# Patient Record
Sex: Female | Born: 1940
Health system: Southern US, Community
[De-identification: ages and names within clinical notes are randomized; demographics above are authoritative.]

## PROBLEM LIST (undated history)

## (undated) DIAGNOSIS — M797 Fibromyalgia: Secondary | ICD-10-CM

## (undated) DIAGNOSIS — I099 Rheumatic heart disease, unspecified: Secondary | ICD-10-CM

## (undated) DIAGNOSIS — I10 Essential (primary) hypertension: Secondary | ICD-10-CM

## (undated) DIAGNOSIS — K589 Irritable bowel syndrome without diarrhea: Secondary | ICD-10-CM

## (undated) DIAGNOSIS — E785 Hyperlipidemia, unspecified: Secondary | ICD-10-CM

## (undated) DIAGNOSIS — M1990 Unspecified osteoarthritis, unspecified site: Secondary | ICD-10-CM

## (undated) DIAGNOSIS — I4891 Unspecified atrial fibrillation: Secondary | ICD-10-CM

## (undated) DIAGNOSIS — R202 Paresthesia of skin: Secondary | ICD-10-CM

## (undated) DIAGNOSIS — K317 Polyp of stomach and duodenum: Secondary | ICD-10-CM

## (undated) HISTORY — PX: ABDOMINAL HYSTERECTOMY: SHX81

## (undated) HISTORY — PX: ESOPHAGOGASTRODUODENOSCOPY: SHX1529

## (undated) HISTORY — DX: Paresthesia of skin: R20.2

## (undated) HISTORY — DX: Fibromyalgia: M79.7

## (undated) HISTORY — PX: KNEE SURGERY: SHX244

## (undated) HISTORY — DX: Irritable bowel syndrome, unspecified: K58.9

## (undated) HISTORY — DX: Polyp of stomach and duodenum: K31.7

## (undated) HISTORY — DX: Unspecified atrial fibrillation: I48.91

## (undated) HISTORY — PX: CATARACT EXTRACTION: SUR2

## (undated) HISTORY — DX: Hyperlipidemia, unspecified: E78.5

## (undated) HISTORY — DX: Essential (primary) hypertension: I10

## (undated) HISTORY — DX: Rheumatic heart disease, unspecified: I09.9

## (undated) HISTORY — DX: Unspecified osteoarthritis, unspecified site: M19.90

---

## 2011-10-18 HISTORY — PX: COLONOSCOPY: SHX174

## 2014-07-05 ENCOUNTER — Encounter: Payer: Self-pay | Admitting: Internal Medicine

## 2014-07-06 ENCOUNTER — Encounter: Payer: Self-pay | Admitting: Critical Care Medicine

## 2014-07-06 ENCOUNTER — Ambulatory Visit (INDEPENDENT_AMBULATORY_CARE_PROVIDER_SITE_OTHER): Payer: Self-pay | Admitting: Critical Care Medicine

## 2014-07-06 VITALS — BP 138/84 | HR 85 | Temp 97.6°F | Ht 63.5 in | Wt 215.8 lb

## 2014-07-06 DIAGNOSIS — R0600 Dyspnea, unspecified: Secondary | ICD-10-CM

## 2014-07-06 DIAGNOSIS — I10 Essential (primary) hypertension: Secondary | ICD-10-CM | POA: Insufficient documentation

## 2014-07-06 DIAGNOSIS — J984 Other disorders of lung: Secondary | ICD-10-CM

## 2014-07-06 DIAGNOSIS — E785 Hyperlipidemia, unspecified: Secondary | ICD-10-CM

## 2014-07-06 DIAGNOSIS — K2270 Barrett's esophagus without dysplasia: Secondary | ICD-10-CM | POA: Insufficient documentation

## 2014-07-06 DIAGNOSIS — M1990 Unspecified osteoarthritis, unspecified site: Secondary | ICD-10-CM | POA: Insufficient documentation

## 2014-07-06 DIAGNOSIS — R0689 Other abnormalities of breathing: Secondary | ICD-10-CM

## 2014-07-06 DIAGNOSIS — M19021 Primary osteoarthritis, right elbow: Secondary | ICD-10-CM

## 2014-07-06 DIAGNOSIS — K317 Polyp of stomach and duodenum: Secondary | ICD-10-CM | POA: Insufficient documentation

## 2014-07-06 DIAGNOSIS — I482 Chronic atrial fibrillation, unspecified: Secondary | ICD-10-CM

## 2014-07-06 DIAGNOSIS — I4891 Unspecified atrial fibrillation: Secondary | ICD-10-CM | POA: Insufficient documentation

## 2014-07-06 DIAGNOSIS — I2510 Atherosclerotic heart disease of native coronary artery without angina pectoris: Secondary | ICD-10-CM

## 2014-07-06 DIAGNOSIS — M19022 Primary osteoarthritis, left elbow: Secondary | ICD-10-CM

## 2014-07-06 NOTE — Progress Notes (Signed)
Subjective:    Patient ID: Joann Mcdonald, female    DOB: [birth year], 74 y.o.   MRN: [id number]  HPI Comments: Patient presents with: Pulmonary Consult: Ref. by Dr. Lyndel Safe for sob with exertion x 51yrs.,no wheezing, no cough. Denies cp or tightness.Runny nose-clear, pnd. No fcs  Referred dyspnea x 5yrs, : up drive way, bending over  Shortness of Breath This is a chronic problem. The current episode started more than 1 month ago. The problem occurs daily (exertional, bend over, up incline). The problem has been gradually worsening. Associated symptoms include abdominal pain. Pertinent negatives include no chest pain, claudication, coryza, ear pain, fever, headaches, hemoptysis, leg pain, leg swelling, neck pain, orthopnea, PND, rash, rhinorrhea, sore throat, sputum production, swollen glands, syncope, vomiting or wheezing. The symptoms are aggravated by exercise. Risk factors include smoking (former smoker, quit 1978). She has tried nothing for the symptoms. There is no history of allergies, aspirin allergies, asthma, bronchiolitis, COPD, DVT, PE or pneumonia.   Past Medical History  Diagnosis Date  . 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 Atrial fibrillation   . HTN (hypertension), benign   . Osteoarthritis   . Fibromyalgia   . Hyperlipemia   . Gastric polyps   . IBS (irritable bowel syndrome)      Family History  Problem Relation Age of Onset  . Angina Mother   . Emphysema Father   . Non-Hodgkin's lymphoma Brother      History   Social History  . Marital Status: Widowed    Spouse Name: N/A  . Number of Children: N/A  . Years of Education: N/A   Occupational History  . Retired    Social History Main Topics  . Smoking status: Former Smoker -- 0.25 packs/day for 20 years    Types: Cigarettes    Quit date: 02/20/1976  . Smokeless tobacco: Never Used  . Alcohol Use: No  . Drug Use: Not on file  . Sexual Activity: Not on file   Other Topics Concern  . Not on  file   Social History Narrative   Lives alone,lives near daughter. Widowed.   Retired.   [employer] x 26 yrs.     No Known Allergies   No outpatient prescriptions prior to visit.   No facility-administered medications prior to visit.     Current outpatient prescriptions:  .  aspirin EC 81 MG tablet, Take 81 mg by mouth daily., Disp: , Rfl:  .  co-enzyme Q-10 30 MG capsule, Take 30 mg by mouth daily., Disp: , Rfl:  .  Krill Oil 300 MG CAPS, Take 300 mg by mouth daily., Disp: , Rfl:  .  LORazepam (ATIVAN) 1 MG tablet, Take 1 mg by mouth every 8 (eight) hours as needed for anxiety., Disp: , Rfl:  .  metoprolol succinate (TOPROL-XL) 100 MG 24 hr tablet, Take 100 mg by mouth daily. Take with or immediately following a meal., Disp: , Rfl:  .  OVER THE COUNTER MEDICATION, Zyflamend (Tumeric) 2 tablets every day by mouth., Disp: , Rfl:  .  pantoprazole (PROTONIX) 40 MG tablet, Take 40 mg by mouth daily., Disp: , Rfl:  .  spironolactone (ALDACTONE) 25 MG tablet, Take 25 mg by mouth daily., Disp: , Rfl:   Review of Systems  Constitutional: Negative for fever, chills, diaphoresis, activity change, appetite change, fatigue and unexpected weight change.  HENT: Positive for postnasal drip and trouble swallowing. Negative for congestion, dental problem, ear discharge, ear  pain, facial swelling, hearing loss, mouth sores, nosebleeds, rhinorrhea, sinus pressure, sneezing, sore throat, tinnitus and voice change.   Eyes: Negative for photophobia, discharge, itching and visual disturbance.  Respiratory: Positive for shortness of breath. Negative for apnea, cough, hemoptysis, sputum production, choking, chest tightness, wheezing and stridor.   Cardiovascular: Positive for palpitations. Negative for chest pain, orthopnea, claudication, leg swelling, syncope and PND.  Gastrointestinal: Positive for abdominal pain. Negative for nausea, vomiting, constipation, blood in stool and abdominal distention.         Hx of GERD, lower sphincter issue  Genitourinary: Negative for dysuria, urgency, frequency, hematuria, flank pain, decreased urine volume and difficulty urinating.  Musculoskeletal: Positive for arthralgias. Negative for myalgias, back pain, joint swelling, gait problem, neck pain and neck stiffness.  Skin: Negative for color change, pallor and rash.  Neurological: Negative for dizziness, tremors, seizures, syncope, speech difficulty, weakness, light-headedness, numbness and headaches.  Hematological: Negative for adenopathy. Does not bruise/bleed easily.  Psychiatric/Behavioral: Negative for confusion, sleep disturbance and agitation. The patient is not nervous/anxious.        Anxiety       Objective:   Physical Exam  Constitutional: Vital signs are normal. She appears well-developed. She is active.  Non-toxic appearance. She does not appear ill. No distress.  obese  HENT:  Head: Normocephalic and atraumatic.  Nose: No mucosal edema, rhinorrhea, sinus tenderness, nasal deformity or septal deviation. No epistaxis. Right sinus exhibits no maxillary sinus tenderness and no frontal sinus tenderness. Left sinus exhibits no maxillary sinus tenderness and no frontal sinus tenderness.  Mouth/Throat: Oropharynx is clear and moist. No oropharyngeal exudate.  Eyes: Conjunctivae and EOM are normal. Pupils are equal, round, and reactive to light. Right eye exhibits no discharge. Left eye exhibits no discharge. No scleral icterus.  Neck: Normal range of motion. Neck supple. Normal carotid pulses, no hepatojugular reflux and no JVD present. No tracheal tenderness and no muscular tenderness present. Carotid bruit is not present. No rigidity. No tracheal deviation, no edema, no erythema and normal range of motion present. No thyroid mass and no thyromegaly present.  Cardiovascular: Normal rate, regular rhythm, S1 normal, S2 normal, normal heart sounds, intact distal pulses and normal pulses.  PMI is not  displaced.  Exam reveals no gallop, no S3, no S4, no distant heart sounds and no friction rub.   No murmur heard.  No systolic murmur is present   No diastolic murmur is present  Pulmonary/Chest: No accessory muscle usage or stridor. No apnea and no tachypnea. No respiratory distress. She has decreased breath sounds in the right lower field and the left lower field. She has no wheezes. She has no rhonchi. She has no rales. Chest wall is not dull to percussion. She exhibits no mass, no tenderness, no bony tenderness and no deformity.  Abdominal: Soft. Normal appearance and bowel sounds are normal. She exhibits no distension, no ascites and no mass. There is no hepatosplenomegaly. There is no tenderness. There is no rigidity, no rebound, no guarding and no CVA tenderness.  Musculoskeletal: Normal range of motion.  Lymphadenopathy:       Head (right side): No submental and no submandibular adenopathy present.       Head (left side): No submental and no submandibular adenopathy present.    She has no cervical adenopathy.    She has no axillary adenopathy.  Neurological: She is alert. She has normal strength and normal reflexes. No cranial nerve deficit or sensory deficit.  Skin: Skin is warm  and dry. No bruising, no ecchymosis, no lesion and no rash noted. She is not diaphoretic. No cyanosis or erythema. No pallor. Nails show no clubbing.  Psychiatric: She has a normal mood and affect. Her speech is normal and behavior is normal.  Vitals reviewed.         Assessment & Plan:  I personally reviewed all images and lab data in the [facility] system as well as any outside material available during this office visit and agree with the  radiology impressions.  I also have reviewed any data /notes/records if available in care everywhere.  Dyspnea and respiratory abnormality Exertional dyspnea , progressive. No desat with ambulation.  No overt evidence of airway obstruction on phys exam.  Pt significantly  obese and prob restrictive from kyphosis Plan CXR Full pfts ONO on RA Once above available, further recs will follow Need outside cards records   I Diagnoses and all orders for this visit:  Chronic atrial fibrillation  Coronary artery disease involving native coronary artery of native heart without angina pectoris  HTN (hypertension), benign  Primary osteoarthritis of both elbows  Hyperlipemia  Gastric polyps  Barrett esophagus  Dyspnea Orders: -     DG Chest 2 View; Future -     Pulmonary Function Test; Future -     Pulse oximetry, overnight; Future -     DG Chest 2 View -     Pulmonary Function Test  Dyspnea and respiratory abnormality

## 2014-07-06 NOTE — Assessment & Plan Note (Signed)
Exertional dyspnea , progressive. No desat with ambulation.  No overt evidence of airway obstruction on phys exam.  Pt significantly obese and prob restrictive from kyphosis Plan CXR Full pfts ONO on RA Once above available, further recs will follow Need outside cards records

## 2014-07-06 NOTE — Patient Instructions (Addendum)
Will obtain records from Dr. Bettina Gavia Chest xray today PFTs at [facility] will be scheduled We will check your oxygen levels at bedtime No medication changes Follow up in 1 month

## 2014-07-07 ENCOUNTER — Telehealth: Payer: Self-pay | Admitting: Critical Care Medicine

## 2014-07-07 DIAGNOSIS — R0600 Dyspnea, unspecified: Secondary | ICD-10-CM

## 2014-07-07 DIAGNOSIS — R0689 Other abnormalities of breathing: Principal | ICD-10-CM

## 2014-07-07 NOTE — Telephone Encounter (Signed)
Spoke with pt.  Discussed cxr results per Dr. Joya Gaskins.  She verbalized understanding and voiced no further questions or concerns at this time.

## 2014-07-07 NOTE — Telephone Encounter (Signed)
Let pt know CXR normal, no active process

## 2014-07-08 LAB — PULMONARY FUNCTION TEST

## 2014-07-20 ENCOUNTER — Telehealth: Payer: Self-pay | Admitting: Critical Care Medicine

## 2014-07-20 DIAGNOSIS — R0689 Other abnormalities of breathing: Principal | ICD-10-CM

## 2014-07-20 DIAGNOSIS — R0600 Dyspnea, unspecified: Secondary | ICD-10-CM

## 2014-07-20 NOTE — Telephone Encounter (Signed)
Let pt know pfts show mild restriction no obstruction  Her breathing is due to obesity and restriction in chest cage Best Rx is weight loss  no lung medications needed

## 2014-07-21 NOTE — Telephone Encounter (Signed)
Spoke with pt.  Discussed PFT results and recs per Dr. Joya Gaskins.  She verbalized understanding and voiced no further questions or concerns at this time.

## 2014-07-21 NOTE — Telephone Encounter (Signed)
[email or web address] for pt 

## 2014-08-10 ENCOUNTER — Ambulatory Visit (INDEPENDENT_AMBULATORY_CARE_PROVIDER_SITE_OTHER): Payer: Self-pay | Admitting: Critical Care Medicine

## 2014-08-10 ENCOUNTER — Encounter: Payer: Self-pay | Admitting: Critical Care Medicine

## 2014-08-10 VITALS — BP 126/72 | HR 91 | Temp 98.3°F | Ht 63.5 in | Wt 215.0 lb

## 2014-08-10 DIAGNOSIS — M419 Scoliosis, unspecified: Principal | ICD-10-CM

## 2014-08-10 DIAGNOSIS — R0600 Dyspnea, unspecified: Secondary | ICD-10-CM

## 2014-08-10 DIAGNOSIS — R0689 Other abnormalities of breathing: Secondary | ICD-10-CM

## 2014-08-10 DIAGNOSIS — J984 Other disorders of lung: Secondary | ICD-10-CM

## 2014-08-10 NOTE — Progress Notes (Signed)
Subjective:    Patient ID: Joann Mcdonald, female    DOB: 09-Dec-1940, 74 y.o.   MRN: [id number]  HPI   08/10/2014 Chief Complaint  Patient presents with  . Follow-up    Sob- same. Did not have oxygen checked at night.Stuffy nose occass. Denies cp or tightness.  meals: cereal or nothing for breakfast, lunch: occ skips,  Dinner: delivered foods.  Ice cream at night.   Dyspnea is unchanged . Pt saw cards: had an echo.   Pt denies any significant sore throat, nasal congestion or excess secretions, fever, chills, sweats, unintended weight loss, pleurtic or exertional chest pain, orthopnea PND, or leg swelling Pt denies any increase in rescue therapy over baseline, denies waking up needing it or having any early am or nocturnal exacerbations of coughing/wheezing/or dyspnea. Pt also denies any obvious fluctuation in symptoms with  weather or environmental change or other alleviating or aggravating factors   Current Medications, Allergies, Complete Past Medical History, Past Surgical History, Family History, and Social History were reviewed in [facility] record per todays encounter:  08/10/2014  Review of Systems  Constitutional: Negative.   HENT: Negative.  Negative for ear pain, postnasal drip, rhinorrhea, sinus pressure, sore throat, trouble swallowing and voice change.   Eyes: Negative.   Respiratory: Positive for shortness of breath. Negative for apnea, cough, choking, chest tightness, wheezing and stridor.   Cardiovascular: Negative.  Negative for chest pain, palpitations and leg swelling.  Gastrointestinal: Negative.  Negative for nausea, vomiting, abdominal pain and abdominal distention.  Genitourinary: Negative.   Musculoskeletal: Negative.  Negative for myalgias and arthralgias.  Skin: Negative.  Negative for rash.  Allergic/Immunologic: Negative.  Negative for environmental allergies and food allergies.  Neurological: Negative.  Negative for dizziness,  syncope, weakness and headaches.  Hematological: Negative.  Negative for adenopathy. Does not bruise/bleed easily.  Psychiatric/Behavioral: Negative.  Negative for sleep disturbance and agitation. The patient is not nervous/anxious.        Objective:   Physical Exam Filed Vitals:   08/10/14 1504  BP: 126/72  Pulse: 91  Temp: 98.3 F (36.8 C)  TempSrc: Oral  Height: 5' 3.5" (1.613 m)  Weight: 215 lb (97.523 kg)  SpO2: 96%    Gen: Pleasant, obese, in no distress,  normal affect  ENT: No lesions,  mouth clear,  oropharynx clear, no postnasal drip  Neck: No JVD, no TMG, no carotid bruits  Lungs: No use of accessory muscles, no dullness to percussion, clear without rales or rhonchi, kyphosis   Cardiovascular: RRR, heart sounds normal, no murmur or gallops, no peripheral edema  Abdomen: soft and NT, no HSM,  BS normal  Musculoskeletal: No deformities, no cyanosis or clubbing  Neuro: alert, non focal  Skin: Warm, no lesions or rashes  No results found.  CT, pfts amb sats  All reviewed Pt refused ono       Assessment & Plan:  I personally reviewed all images and lab data in the [facility] system as well as any outside material available during this office visit and agree with the  radiology impressions.   Dyspnea and respiratory abnormality Associated restrictive defect in chest cage d/t kyphosis with associated obesity.  Restriction on PFTs.  No obstruction. Neg CT chest no desats with exertion Plan No further lung Rx or workup indicated Needs to focus on weight loss, i spent 70min of visit going over pts meal plan and need for changes to reduce carbs and get the pt moving more. Pt  has a lot of excuses to not change behaviour.  I spent time counseling in this area  Restrictive lung disease due to kyphoscoliosis See dyspnea eval   Joann Mcdonald was seen today for follow-up.  Diagnoses and all orders for this visit:  Restrictive lung disease due to kyphoscoliosis  Dyspnea  and respiratory abnormality    I had an extended discussion with the patient and or family lasting 10 minutes of a 25 minute visit including:  Diet education, dz state, tx options

## 2014-08-10 NOTE — Patient Instructions (Signed)
Focus on weight loss No lung treatments recommended Return as needed I will obtain cardiology records

## 2014-08-11 DIAGNOSIS — J984 Other disorders of lung: Secondary | ICD-10-CM | POA: Insufficient documentation

## 2014-08-11 DIAGNOSIS — M419 Scoliosis, unspecified: Principal | ICD-10-CM

## 2014-08-11 NOTE — Assessment & Plan Note (Signed)
Associated restrictive defect in chest cage d/t kyphosis with associated obesity.  Restriction on PFTs.  No obstruction. Neg CT chest no desats with exertion Plan No further lung Rx or workup indicated Needs to focus on weight loss, i spent 67min of visit going over pts meal plan and need for changes to reduce carbs and get the pt moving more. Pt has a lot of excuses to not change behaviour.  I spent time counseling in this area

## 2014-08-11 NOTE — Assessment & Plan Note (Signed)
See dyspnea eval

## 2015-09-29 ENCOUNTER — Other Ambulatory Visit: Payer: Self-pay | Admitting: Orthopedic Surgery

## 2015-09-29 DIAGNOSIS — M546 Pain in thoracic spine: Secondary | ICD-10-CM

## 2015-10-01 ENCOUNTER — Ambulatory Visit
Admission: RE | Admit: 2015-10-01 | Discharge: 2015-10-01 | Disposition: A | Discharge status: Home / Self Care | Payer: Medicare Other | Source: Ambulatory Visit | Attending: Orthopedic Surgery | Admitting: Orthopedic Surgery

## 2015-10-01 DIAGNOSIS — M546 Pain in thoracic spine: Secondary | ICD-10-CM

## 2016-03-30 DIAGNOSIS — I482 Chronic atrial fibrillation: Secondary | ICD-10-CM | POA: Diagnosis not present

## 2016-03-30 DIAGNOSIS — I119 Hypertensive heart disease without heart failure: Secondary | ICD-10-CM | POA: Diagnosis not present

## 2016-03-30 DIAGNOSIS — R0602 Shortness of breath: Secondary | ICD-10-CM | POA: Diagnosis not present

## 2016-04-05 DIAGNOSIS — R002 Palpitations: Secondary | ICD-10-CM | POA: Diagnosis not present

## 2016-04-05 DIAGNOSIS — R0602 Shortness of breath: Secondary | ICD-10-CM | POA: Diagnosis not present

## 2016-04-05 DIAGNOSIS — I119 Hypertensive heart disease without heart failure: Secondary | ICD-10-CM | POA: Diagnosis not present

## 2016-04-09 DIAGNOSIS — R002 Palpitations: Secondary | ICD-10-CM | POA: Diagnosis not present

## 2016-05-02 DIAGNOSIS — R0602 Shortness of breath: Secondary | ICD-10-CM | POA: Diagnosis not present

## 2016-05-02 DIAGNOSIS — I1310 Hypertensive heart and chronic kidney disease without heart failure, with stage 1 through stage 4 chronic kidney disease, or unspecified chronic kidney disease: Secondary | ICD-10-CM | POA: Diagnosis not present

## 2016-05-02 DIAGNOSIS — I482 Chronic atrial fibrillation: Secondary | ICD-10-CM | POA: Diagnosis not present

## 2016-06-18 DIAGNOSIS — R69 Illness, unspecified: Secondary | ICD-10-CM | POA: Diagnosis not present

## 2016-06-26 DIAGNOSIS — J302 Other seasonal allergic rhinitis: Secondary | ICD-10-CM | POA: Diagnosis not present

## 2016-06-26 DIAGNOSIS — Z6837 Body mass index (BMI) 37.0-37.9, adult: Secondary | ICD-10-CM | POA: Diagnosis not present

## 2016-06-26 DIAGNOSIS — D510 Vitamin B12 deficiency anemia due to intrinsic factor deficiency: Secondary | ICD-10-CM | POA: Diagnosis not present

## 2016-07-12 DIAGNOSIS — R69 Illness, unspecified: Secondary | ICD-10-CM | POA: Diagnosis not present

## 2016-07-31 DIAGNOSIS — L239 Allergic contact dermatitis, unspecified cause: Secondary | ICD-10-CM | POA: Diagnosis not present

## 2016-07-31 DIAGNOSIS — C44529 Squamous cell carcinoma of skin of other part of trunk: Secondary | ICD-10-CM | POA: Diagnosis not present

## 2016-07-31 DIAGNOSIS — L820 Inflamed seborrheic keratosis: Secondary | ICD-10-CM | POA: Diagnosis not present

## 2016-07-31 DIAGNOSIS — D225 Melanocytic nevi of trunk: Secondary | ICD-10-CM | POA: Diagnosis not present

## 2016-07-31 DIAGNOSIS — L821 Other seborrheic keratosis: Secondary | ICD-10-CM | POA: Diagnosis not present

## 2016-07-31 DIAGNOSIS — Z1231 Encounter for screening mammogram for malignant neoplasm of breast: Secondary | ICD-10-CM | POA: Diagnosis not present

## 2016-07-31 DIAGNOSIS — D2239 Melanocytic nevi of other parts of face: Secondary | ICD-10-CM | POA: Diagnosis not present

## 2016-11-22 DIAGNOSIS — I4892 Unspecified atrial flutter: Secondary | ICD-10-CM | POA: Diagnosis not present

## 2016-11-22 DIAGNOSIS — E669 Obesity, unspecified: Secondary | ICD-10-CM | POA: Diagnosis not present

## 2016-11-22 DIAGNOSIS — K219 Gastro-esophageal reflux disease without esophagitis: Secondary | ICD-10-CM | POA: Diagnosis not present

## 2016-11-22 DIAGNOSIS — Z0000 Encounter for general adult medical examination without abnormal findings: Secondary | ICD-10-CM | POA: Diagnosis not present

## 2016-11-22 DIAGNOSIS — Z7982 Long term (current) use of aspirin: Secondary | ICD-10-CM | POA: Diagnosis not present

## 2016-11-22 DIAGNOSIS — Z87891 Personal history of nicotine dependence: Secondary | ICD-10-CM | POA: Diagnosis not present

## 2016-11-22 DIAGNOSIS — I4891 Unspecified atrial fibrillation: Secondary | ICD-10-CM | POA: Diagnosis not present

## 2016-11-22 DIAGNOSIS — Z6838 Body mass index (BMI) 38.0-38.9, adult: Secondary | ICD-10-CM | POA: Diagnosis not present

## 2016-11-22 DIAGNOSIS — R609 Edema, unspecified: Secondary | ICD-10-CM | POA: Diagnosis not present

## 2016-12-18 DIAGNOSIS — R1310 Dysphagia, unspecified: Secondary | ICD-10-CM | POA: Diagnosis not present

## 2016-12-18 DIAGNOSIS — R1011 Right upper quadrant pain: Secondary | ICD-10-CM | POA: Diagnosis not present

## 2016-12-31 DIAGNOSIS — R1011 Right upper quadrant pain: Secondary | ICD-10-CM | POA: Diagnosis not present

## 2016-12-31 DIAGNOSIS — N281 Cyst of kidney, acquired: Secondary | ICD-10-CM | POA: Diagnosis not present

## 2017-01-02 DIAGNOSIS — K449 Diaphragmatic hernia without obstruction or gangrene: Secondary | ICD-10-CM | POA: Diagnosis not present

## 2017-01-02 DIAGNOSIS — K228 Other specified diseases of esophagus: Secondary | ICD-10-CM | POA: Diagnosis not present

## 2017-01-02 DIAGNOSIS — K2900 Acute gastritis without bleeding: Secondary | ICD-10-CM | POA: Diagnosis not present

## 2017-01-02 DIAGNOSIS — E785 Hyperlipidemia, unspecified: Secondary | ICD-10-CM | POA: Diagnosis not present

## 2017-01-02 DIAGNOSIS — I10 Essential (primary) hypertension: Secondary | ICD-10-CM | POA: Diagnosis not present

## 2017-01-02 DIAGNOSIS — E669 Obesity, unspecified: Secondary | ICD-10-CM | POA: Diagnosis not present

## 2017-01-02 DIAGNOSIS — M1990 Unspecified osteoarthritis, unspecified site: Secondary | ICD-10-CM | POA: Diagnosis not present

## 2017-01-02 DIAGNOSIS — K2970 Gastritis, unspecified, without bleeding: Secondary | ICD-10-CM | POA: Diagnosis not present

## 2017-01-02 DIAGNOSIS — R1310 Dysphagia, unspecified: Secondary | ICD-10-CM | POA: Diagnosis not present

## 2017-01-02 DIAGNOSIS — R1011 Right upper quadrant pain: Secondary | ICD-10-CM | POA: Diagnosis not present

## 2017-01-02 DIAGNOSIS — M797 Fibromyalgia: Secondary | ICD-10-CM | POA: Diagnosis not present

## 2017-01-02 DIAGNOSIS — K219 Gastro-esophageal reflux disease without esophagitis: Secondary | ICD-10-CM | POA: Diagnosis not present

## 2017-01-02 HISTORY — PX: ESOPHAGOGASTRODUODENOSCOPY: SHX1529

## 2017-04-19 ENCOUNTER — Telehealth: Payer: Self-pay | Admitting: Cardiology

## 2017-04-19 NOTE — Telephone Encounter (Signed)
Contacted patient regarding following up with Dr. Bettina Gavia in the office. Patient states she does not have a way to get here. Patient advised to contact her PCP regarding refill. Patient verbalized understanding. No further questions.

## 2017-04-19 NOTE — Telephone Encounter (Signed)
Patient needs refill of metoprolol sent to [facility] on [address] in [place]

## 2017-05-08 ENCOUNTER — Encounter: Payer: Self-pay | Admitting: Gastroenterology

## 2017-05-17 DIAGNOSIS — J984 Other disorders of lung: Secondary | ICD-10-CM | POA: Diagnosis not present

## 2017-05-17 DIAGNOSIS — I10 Essential (primary) hypertension: Secondary | ICD-10-CM | POA: Diagnosis not present

## 2017-05-17 DIAGNOSIS — R0609 Other forms of dyspnea: Secondary | ICD-10-CM | POA: Diagnosis not present

## 2017-05-17 DIAGNOSIS — I482 Chronic atrial fibrillation: Secondary | ICD-10-CM | POA: Diagnosis not present

## 2017-05-17 DIAGNOSIS — Z7982 Long term (current) use of aspirin: Secondary | ICD-10-CM | POA: Diagnosis not present

## 2017-05-17 DIAGNOSIS — Z87891 Personal history of nicotine dependence: Secondary | ICD-10-CM | POA: Diagnosis not present

## 2017-05-29 DIAGNOSIS — L600 Ingrowing nail: Secondary | ICD-10-CM | POA: Diagnosis not present

## 2017-05-29 DIAGNOSIS — B351 Tinea unguium: Secondary | ICD-10-CM | POA: Diagnosis not present

## 2017-05-29 DIAGNOSIS — L603 Nail dystrophy: Secondary | ICD-10-CM | POA: Diagnosis not present

## 2017-05-29 DIAGNOSIS — M79672 Pain in left foot: Secondary | ICD-10-CM | POA: Diagnosis not present

## 2017-05-29 DIAGNOSIS — M79671 Pain in right foot: Secondary | ICD-10-CM | POA: Diagnosis not present

## 2017-05-29 DIAGNOSIS — L84 Corns and callosities: Secondary | ICD-10-CM | POA: Diagnosis not present

## 2017-06-18 DIAGNOSIS — I160 Hypertensive urgency: Secondary | ICD-10-CM | POA: Diagnosis not present

## 2017-06-20 DIAGNOSIS — Z6837 Body mass index (BMI) 37.0-37.9, adult: Secondary | ICD-10-CM | POA: Diagnosis not present

## 2017-06-20 DIAGNOSIS — Z9181 History of falling: Secondary | ICD-10-CM | POA: Diagnosis not present

## 2017-06-20 DIAGNOSIS — Z1331 Encounter for screening for depression: Secondary | ICD-10-CM | POA: Diagnosis not present

## 2017-06-20 DIAGNOSIS — Z1339 Encounter for screening examination for other mental health and behavioral disorders: Secondary | ICD-10-CM | POA: Diagnosis not present

## 2017-06-20 DIAGNOSIS — Z79899 Other long term (current) drug therapy: Secondary | ICD-10-CM | POA: Diagnosis not present

## 2017-06-20 DIAGNOSIS — R5383 Other fatigue: Secondary | ICD-10-CM | POA: Diagnosis not present

## 2017-06-20 DIAGNOSIS — E785 Hyperlipidemia, unspecified: Secondary | ICD-10-CM | POA: Diagnosis not present

## 2017-06-20 DIAGNOSIS — Z0000 Encounter for general adult medical examination without abnormal findings: Secondary | ICD-10-CM | POA: Diagnosis not present

## 2017-08-01 DIAGNOSIS — L820 Inflamed seborrheic keratosis: Secondary | ICD-10-CM | POA: Diagnosis not present

## 2017-08-01 DIAGNOSIS — C44319 Basal cell carcinoma of skin of other parts of face: Secondary | ICD-10-CM | POA: Diagnosis not present

## 2017-08-01 DIAGNOSIS — D2239 Melanocytic nevi of other parts of face: Secondary | ICD-10-CM | POA: Diagnosis not present

## 2017-08-01 DIAGNOSIS — L821 Other seborrheic keratosis: Secondary | ICD-10-CM | POA: Diagnosis not present

## 2017-08-01 DIAGNOSIS — D1801 Hemangioma of skin and subcutaneous tissue: Secondary | ICD-10-CM | POA: Diagnosis not present

## 2017-08-01 DIAGNOSIS — D225 Melanocytic nevi of trunk: Secondary | ICD-10-CM | POA: Diagnosis not present

## 2017-09-18 DIAGNOSIS — C44319 Basal cell carcinoma of skin of other parts of face: Secondary | ICD-10-CM | POA: Diagnosis not present

## 2017-09-20 IMAGING — MR MR THORACIC SPINE W/O CM
4 of 6 series · 19 of 48 positions shown · non-contrast
Comparison: None.

CLINICAL DATA: Thoracic spine pain

EXAM:
MRI THORACIC SPINE WITHOUT CONTRAST
TECHNIQUE: Multiplanar, multisequence MR imaging of the thoracic spine was
performed. No intravenous contrast was administered.

[Series 17: T1 · sagittal · 3.0mm · 0.67mm/px · 3 of 15 slices shown]
[im 1/15]
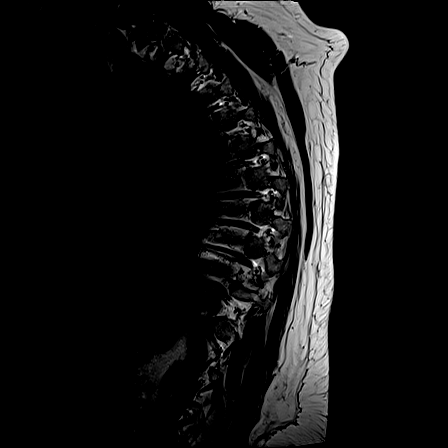
[im 8/15]
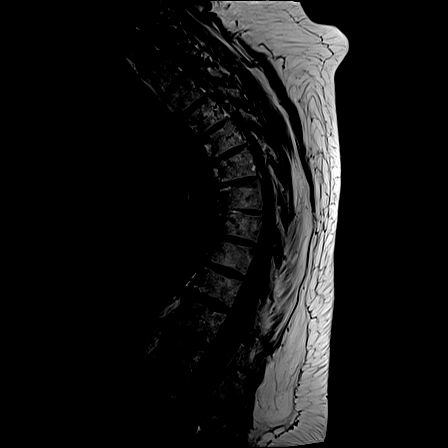
[im 15/15]
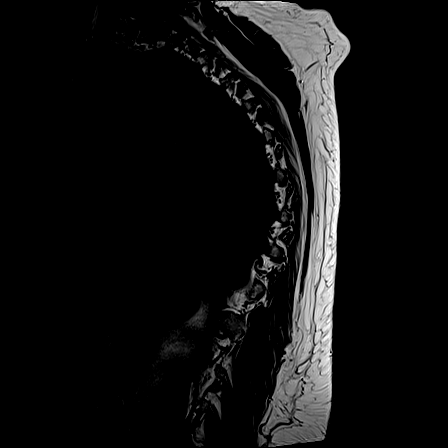

[Series 18: T2 · sagittal · 3.0mm · 0.67mm/px · 5 of 15 slices shown (1 of 2)]
[im 1/15]
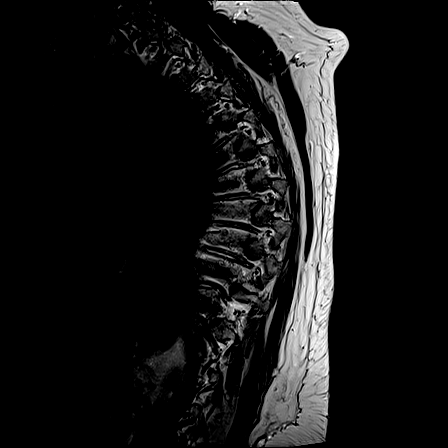
[im 4/15]
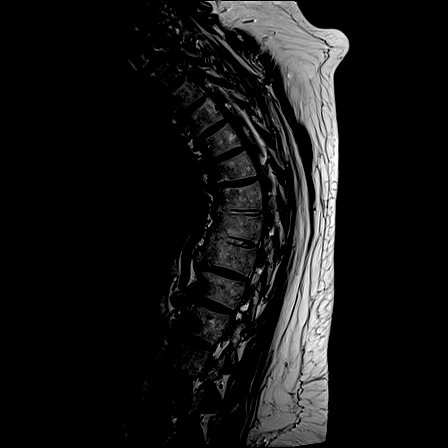
[im 8/15]
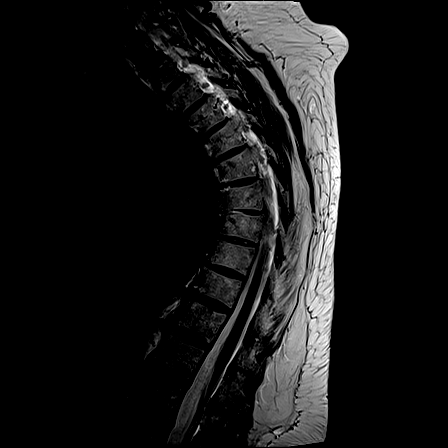
[im 11/15]
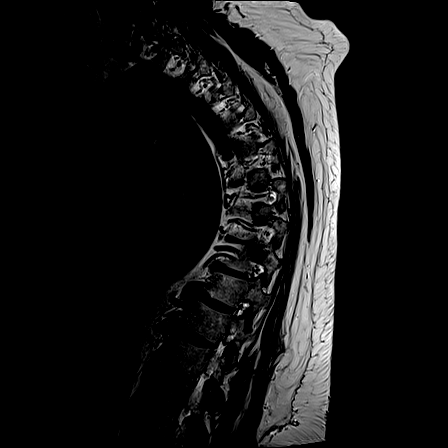
[im 15/15]
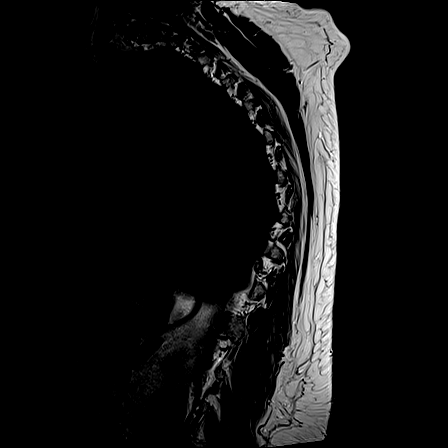

[Series 19: STIR · sagittal · 3.0mm · 0.67mm/px · 3 of 15 slices shown]
[im 1/15]
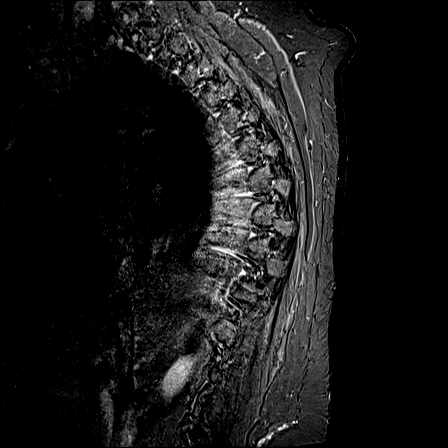
[im 8/15]
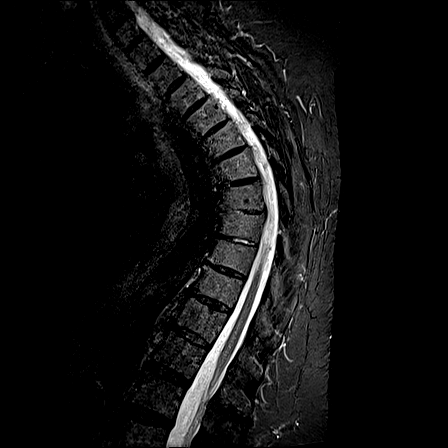
[im 15/15]
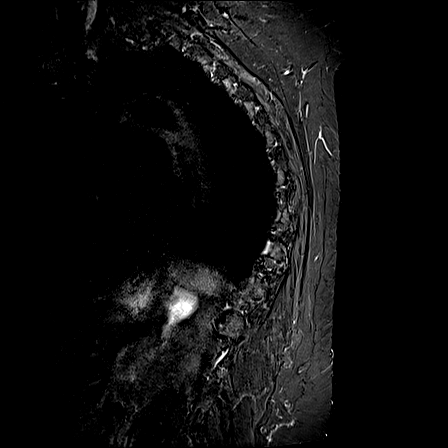

[Series 20: T2 · axial · 4.0mm · 0.28mm/px · z∈[-258,-58]mm · 8 of 42 slices shown (2 of 2)]
[im 1/42]
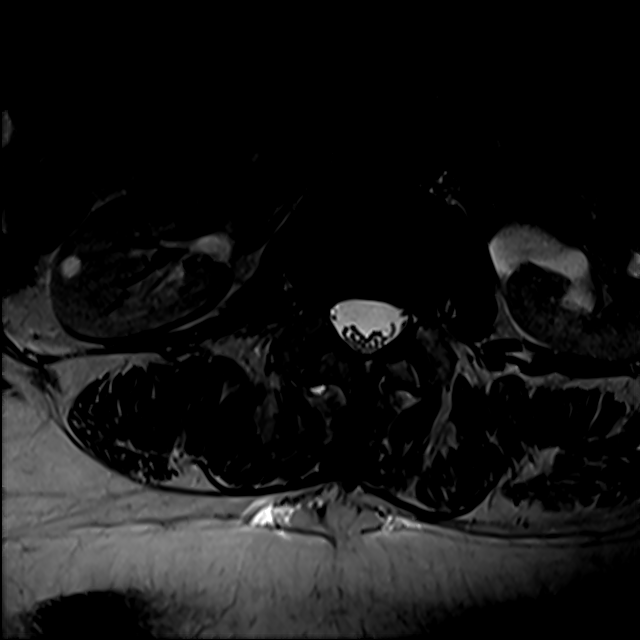
[im 7/42]
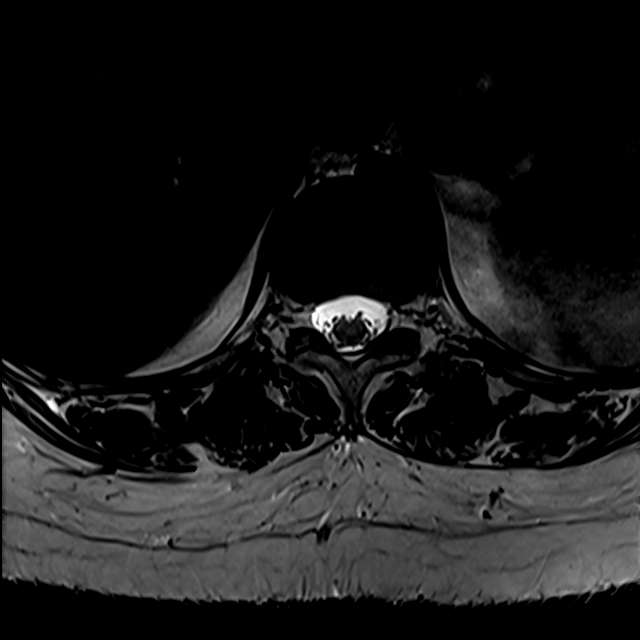
[im 13/42]
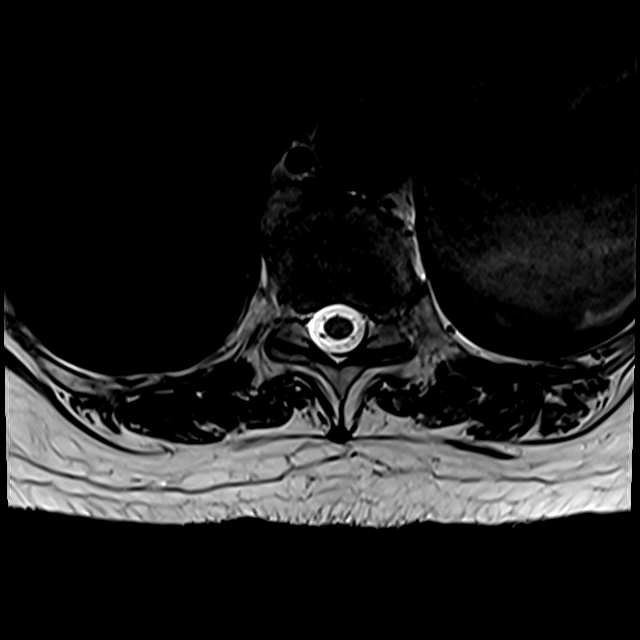
[im 19/42]
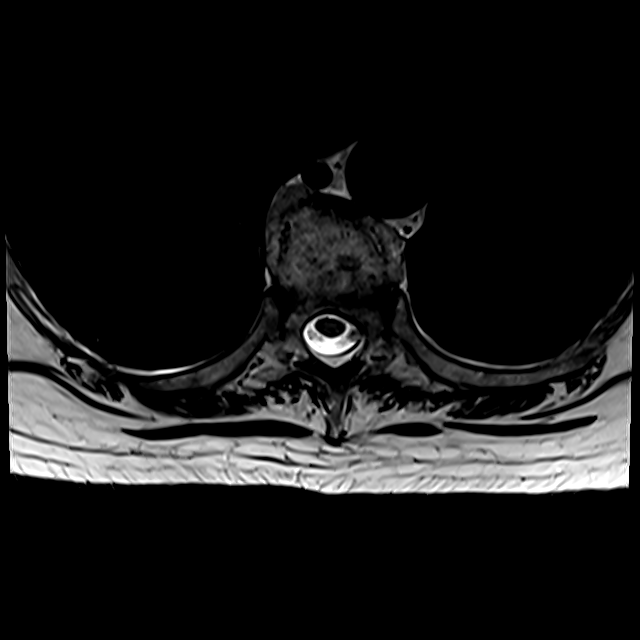
[im 23/42]
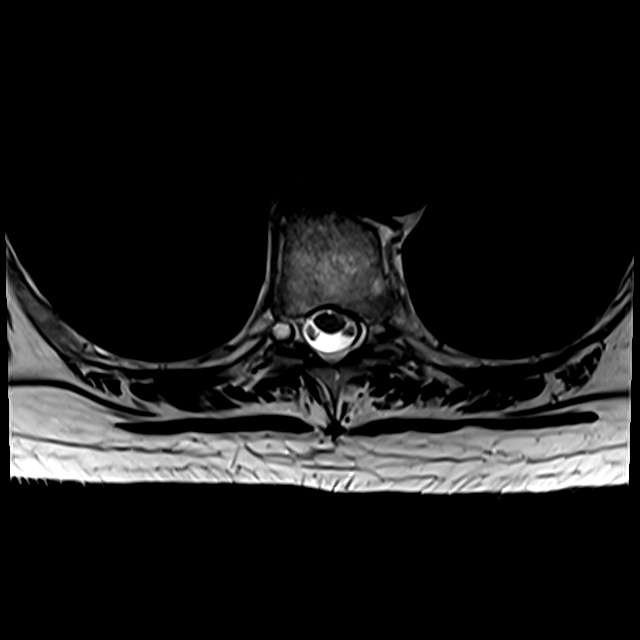
[im 29/42]
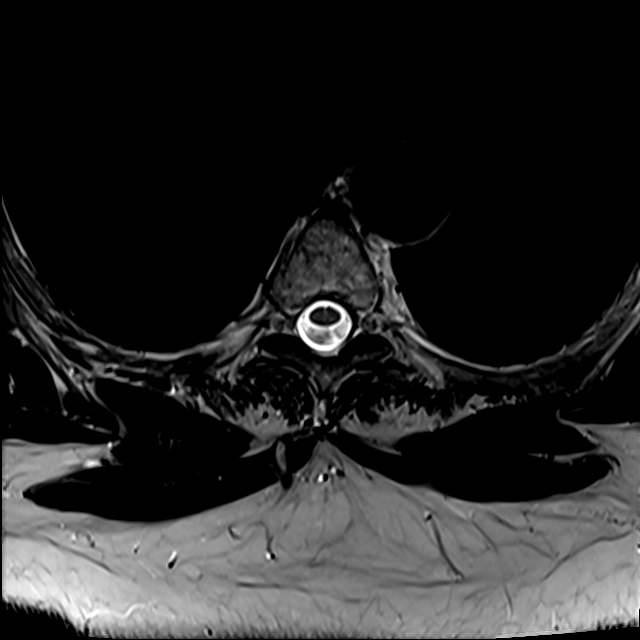
[im 35/42]
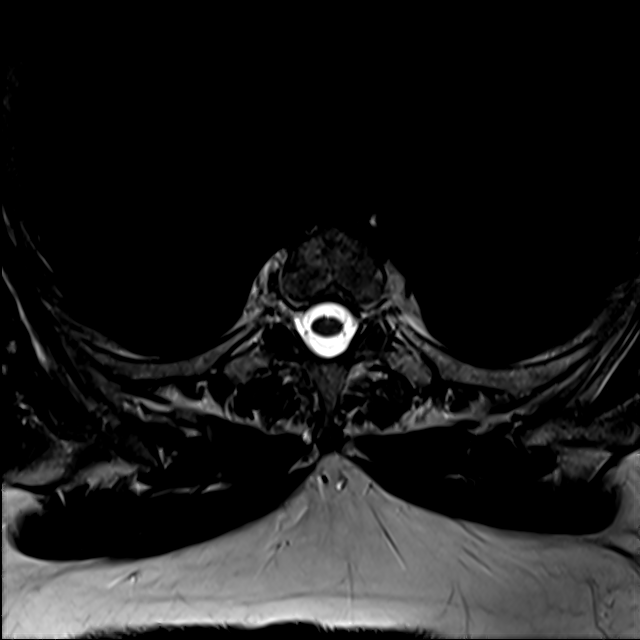
[im 42/42]
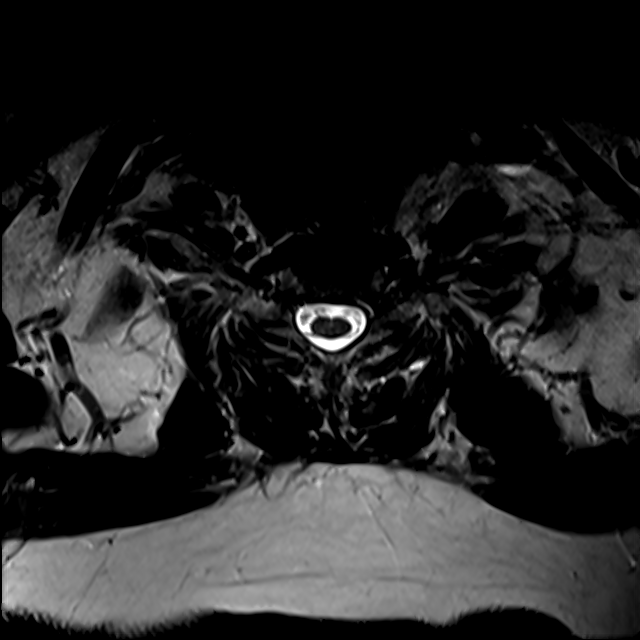

[19 of 48 positions shown; findings below may reference images not displayed]

FINDINGS: Alignment: Normal alignment. Accentuated dorsal kyphosis centered at
T7. 65 degrees of kyphosis is measured.

Vertebrae: Negative for fracture or mass. No bone marrow edema. Bone
marrow appears normal.

Cord:  Normal cord signal.  No cord lesion or cord compression

Paraspinal and other soft tissues: Paraspinous muscles normal. No
pleural effusion. Paraspinous soft tissues appear normal.

Disc levels:

Disc degeneration in the mid thoracic spine from T5 through T10 with
disc space narrowing. No significant spinal stenosis. Small
osteophyte on the left at T7-8 without significant spinal stenosis.
IMPRESSION: Extension weighted dorsal kyphosis, 65 degrees at T7

Thoracic disc degeneration. Negative for disc protrusion or spinal
stenosis.

## 2017-10-28 DIAGNOSIS — I10 Essential (primary) hypertension: Secondary | ICD-10-CM | POA: Diagnosis not present

## 2017-10-28 DIAGNOSIS — I482 Chronic atrial fibrillation: Secondary | ICD-10-CM | POA: Diagnosis not present

## 2017-10-30 DIAGNOSIS — R230 Cyanosis: Secondary | ICD-10-CM | POA: Diagnosis not present

## 2017-10-30 DIAGNOSIS — I482 Chronic atrial fibrillation: Secondary | ICD-10-CM | POA: Diagnosis not present

## 2017-10-30 DIAGNOSIS — R002 Palpitations: Secondary | ICD-10-CM | POA: Diagnosis not present

## 2017-10-30 DIAGNOSIS — I10 Essential (primary) hypertension: Secondary | ICD-10-CM | POA: Diagnosis not present

## 2017-10-30 DIAGNOSIS — M79605 Pain in left leg: Secondary | ICD-10-CM | POA: Diagnosis not present

## 2018-01-02 DIAGNOSIS — M79605 Pain in left leg: Secondary | ICD-10-CM | POA: Diagnosis not present

## 2018-01-02 DIAGNOSIS — R230 Cyanosis: Secondary | ICD-10-CM | POA: Diagnosis not present

## 2018-01-03 DIAGNOSIS — I10 Essential (primary) hypertension: Secondary | ICD-10-CM | POA: Diagnosis not present

## 2018-01-03 DIAGNOSIS — I4821 Permanent atrial fibrillation: Secondary | ICD-10-CM | POA: Diagnosis not present

## 2018-01-03 DIAGNOSIS — R002 Palpitations: Secondary | ICD-10-CM | POA: Diagnosis not present

## 2018-01-03 DIAGNOSIS — M79605 Pain in left leg: Secondary | ICD-10-CM | POA: Diagnosis not present

## 2018-03-31 DIAGNOSIS — R1310 Dysphagia, unspecified: Secondary | ICD-10-CM | POA: Diagnosis not present

## 2018-03-31 DIAGNOSIS — K5730 Diverticulosis of large intestine without perforation or abscess without bleeding: Secondary | ICD-10-CM | POA: Diagnosis not present

## 2018-03-31 DIAGNOSIS — K219 Gastro-esophageal reflux disease without esophagitis: Secondary | ICD-10-CM | POA: Diagnosis not present

## 2018-03-31 DIAGNOSIS — K5900 Constipation, unspecified: Secondary | ICD-10-CM | POA: Diagnosis not present

## 2018-04-21 DIAGNOSIS — I4821 Permanent atrial fibrillation: Secondary | ICD-10-CM | POA: Diagnosis not present

## 2018-04-21 DIAGNOSIS — R0609 Other forms of dyspnea: Secondary | ICD-10-CM | POA: Diagnosis not present

## 2018-04-21 DIAGNOSIS — R002 Palpitations: Secondary | ICD-10-CM | POA: Diagnosis not present

## 2018-04-21 DIAGNOSIS — I10 Essential (primary) hypertension: Secondary | ICD-10-CM | POA: Diagnosis not present

## 2018-06-19 DIAGNOSIS — I10 Essential (primary) hypertension: Secondary | ICD-10-CM | POA: Diagnosis not present

## 2018-06-19 DIAGNOSIS — E785 Hyperlipidemia, unspecified: Secondary | ICD-10-CM | POA: Diagnosis not present

## 2018-07-19 DIAGNOSIS — E785 Hyperlipidemia, unspecified: Secondary | ICD-10-CM | POA: Diagnosis not present

## 2018-07-19 DIAGNOSIS — I10 Essential (primary) hypertension: Secondary | ICD-10-CM | POA: Diagnosis not present

## 2018-08-08 DIAGNOSIS — R5383 Other fatigue: Secondary | ICD-10-CM | POA: Diagnosis not present

## 2018-08-08 DIAGNOSIS — R5381 Other malaise: Secondary | ICD-10-CM | POA: Diagnosis not present

## 2018-08-08 DIAGNOSIS — R509 Fever, unspecified: Secondary | ICD-10-CM | POA: Diagnosis not present

## 2018-08-12 DIAGNOSIS — R002 Palpitations: Secondary | ICD-10-CM | POA: Diagnosis not present

## 2018-08-12 DIAGNOSIS — R5383 Other fatigue: Secondary | ICD-10-CM | POA: Diagnosis not present

## 2018-08-12 DIAGNOSIS — I10 Essential (primary) hypertension: Secondary | ICD-10-CM | POA: Diagnosis not present

## 2018-08-12 DIAGNOSIS — Z79899 Other long term (current) drug therapy: Secondary | ICD-10-CM | POA: Diagnosis not present

## 2018-08-12 DIAGNOSIS — H6123 Impacted cerumen, bilateral: Secondary | ICD-10-CM | POA: Diagnosis not present

## 2018-08-12 DIAGNOSIS — R69 Illness, unspecified: Secondary | ICD-10-CM | POA: Diagnosis not present

## 2018-08-12 DIAGNOSIS — E785 Hyperlipidemia, unspecified: Secondary | ICD-10-CM | POA: Diagnosis not present

## 2018-08-12 DIAGNOSIS — Z6835 Body mass index (BMI) 35.0-35.9, adult: Secondary | ICD-10-CM | POA: Diagnosis not present

## 2018-09-19 DIAGNOSIS — E785 Hyperlipidemia, unspecified: Secondary | ICD-10-CM | POA: Diagnosis not present

## 2018-09-19 DIAGNOSIS — I10 Essential (primary) hypertension: Secondary | ICD-10-CM | POA: Diagnosis not present

## 2019-01-24 DIAGNOSIS — D6869 Other thrombophilia: Secondary | ICD-10-CM | POA: Diagnosis not present

## 2019-01-24 DIAGNOSIS — I10 Essential (primary) hypertension: Secondary | ICD-10-CM | POA: Diagnosis not present

## 2019-01-24 DIAGNOSIS — I4891 Unspecified atrial fibrillation: Secondary | ICD-10-CM | POA: Diagnosis not present

## 2019-01-24 DIAGNOSIS — R609 Edema, unspecified: Secondary | ICD-10-CM | POA: Diagnosis not present

## 2019-01-24 DIAGNOSIS — R69 Illness, unspecified: Secondary | ICD-10-CM | POA: Diagnosis not present

## 2019-01-24 DIAGNOSIS — K219 Gastro-esophageal reflux disease without esophagitis: Secondary | ICD-10-CM | POA: Diagnosis not present

## 2019-01-24 DIAGNOSIS — R32 Unspecified urinary incontinence: Secondary | ICD-10-CM | POA: Diagnosis not present

## 2019-01-24 DIAGNOSIS — I471 Supraventricular tachycardia: Secondary | ICD-10-CM | POA: Diagnosis not present

## 2019-03-21 DIAGNOSIS — E7800 Pure hypercholesterolemia, unspecified: Secondary | ICD-10-CM | POA: Diagnosis not present

## 2019-03-21 DIAGNOSIS — R69 Illness, unspecified: Secondary | ICD-10-CM | POA: Diagnosis not present

## 2019-03-21 DIAGNOSIS — I10 Essential (primary) hypertension: Secondary | ICD-10-CM | POA: Diagnosis not present

## 2019-04-19 DIAGNOSIS — M810 Age-related osteoporosis without current pathological fracture: Secondary | ICD-10-CM | POA: Diagnosis not present

## 2019-04-19 DIAGNOSIS — I10 Essential (primary) hypertension: Secondary | ICD-10-CM | POA: Diagnosis not present

## 2019-04-19 DIAGNOSIS — K219 Gastro-esophageal reflux disease without esophagitis: Secondary | ICD-10-CM | POA: Diagnosis not present

## 2019-06-01 DIAGNOSIS — I10 Essential (primary) hypertension: Secondary | ICD-10-CM | POA: Diagnosis not present

## 2019-06-01 DIAGNOSIS — R0600 Dyspnea, unspecified: Secondary | ICD-10-CM | POA: Diagnosis not present

## 2019-06-01 DIAGNOSIS — I4821 Permanent atrial fibrillation: Secondary | ICD-10-CM | POA: Diagnosis not present

## 2019-08-05 DIAGNOSIS — L820 Inflamed seborrheic keratosis: Secondary | ICD-10-CM | POA: Diagnosis not present

## 2019-08-05 DIAGNOSIS — L821 Other seborrheic keratosis: Secondary | ICD-10-CM | POA: Diagnosis not present

## 2019-08-05 DIAGNOSIS — C4401 Basal cell carcinoma of skin of lip: Secondary | ICD-10-CM | POA: Diagnosis not present

## 2019-08-06 DIAGNOSIS — I4891 Unspecified atrial fibrillation: Secondary | ICD-10-CM | POA: Diagnosis not present

## 2019-08-06 DIAGNOSIS — K219 Gastro-esophageal reflux disease without esophagitis: Secondary | ICD-10-CM | POA: Diagnosis not present

## 2019-08-06 DIAGNOSIS — Z008 Encounter for other general examination: Secondary | ICD-10-CM | POA: Diagnosis not present

## 2019-08-06 DIAGNOSIS — D6869 Other thrombophilia: Secondary | ICD-10-CM | POA: Diagnosis not present

## 2019-08-06 DIAGNOSIS — R32 Unspecified urinary incontinence: Secondary | ICD-10-CM | POA: Diagnosis not present

## 2019-08-06 DIAGNOSIS — G3184 Mild cognitive impairment, so stated: Secondary | ICD-10-CM | POA: Diagnosis not present

## 2019-08-06 DIAGNOSIS — I10 Essential (primary) hypertension: Secondary | ICD-10-CM | POA: Diagnosis not present

## 2019-08-06 DIAGNOSIS — I739 Peripheral vascular disease, unspecified: Secondary | ICD-10-CM | POA: Diagnosis not present

## 2019-08-06 DIAGNOSIS — R69 Illness, unspecified: Secondary | ICD-10-CM | POA: Diagnosis not present

## 2019-09-07 DIAGNOSIS — R413 Other amnesia: Secondary | ICD-10-CM | POA: Diagnosis not present

## 2019-09-07 DIAGNOSIS — Z6836 Body mass index (BMI) 36.0-36.9, adult: Secondary | ICD-10-CM | POA: Diagnosis not present

## 2019-09-07 DIAGNOSIS — I10 Essential (primary) hypertension: Secondary | ICD-10-CM | POA: Diagnosis not present

## 2019-12-02 DIAGNOSIS — I10 Essential (primary) hypertension: Secondary | ICD-10-CM | POA: Diagnosis not present

## 2019-12-02 DIAGNOSIS — I4821 Permanent atrial fibrillation: Secondary | ICD-10-CM | POA: Diagnosis not present

## 2019-12-02 DIAGNOSIS — J984 Other disorders of lung: Secondary | ICD-10-CM | POA: Diagnosis not present

## 2019-12-23 DIAGNOSIS — I4821 Permanent atrial fibrillation: Secondary | ICD-10-CM | POA: Diagnosis not present

## 2019-12-23 DIAGNOSIS — I10 Essential (primary) hypertension: Secondary | ICD-10-CM | POA: Diagnosis not present

## 2020-01-20 ENCOUNTER — Other Ambulatory Visit: Payer: Self-pay | Admitting: *Deleted

## 2020-01-20 ENCOUNTER — Encounter: Payer: Self-pay | Admitting: *Deleted

## 2020-01-22 ENCOUNTER — Ambulatory Visit: Payer: Medicare HMO | Admitting: Diagnostic Neuroimaging

## 2020-01-22 ENCOUNTER — Encounter: Payer: Self-pay | Admitting: Diagnostic Neuroimaging

## 2020-01-22 VITALS — BP 158/97 | HR 79 | Ht 63.0 in | Wt 207.0 lb

## 2020-01-22 DIAGNOSIS — H918X2 Other specified hearing loss, left ear: Secondary | ICD-10-CM

## 2020-01-22 DIAGNOSIS — G5132 Clonic hemifacial spasm, left: Secondary | ICD-10-CM | POA: Diagnosis not present

## 2020-01-22 NOTE — Progress Notes (Signed)
[facility]  PATIENT: Joann Mcdonald DOB: 01/01/1941  REFERRING CLINICIAN: Angelina Sheriff, MD HISTORY FROM: patient  REASON FOR VISIT: new consult    HISTORICAL  CHIEF COMPLAINT:  Chief Complaint  Patient presents with  . New Patient (Initial Visit)    pt with daughter, rm 7. was on lexapro and wanted to get off of it. as she was weaning off of it the medication she developed muscle spasms or lightening type sensation on left side of face. she states that it gotten better over the last couple weeks. family witnessed the pulling look to the face.     HISTORY OF PRESENT ILLNESS:   79 year old female here for evaluation of left eye and face twitching.  Symptoms started in September 2021.  She noticed some twitching in her left eye then spread to left face.  Symptoms occur on a daily basis.  They are mild and not painful.  No numbness or tingling.  Patient has chronic hearing loss in the left side.  Also has some ringing in the ear on the left side.  No specific triggering or aggravating factors.  Symptoms are spontaneously improving.     REVIEW OF SYSTEMS: Full 14 system review of systems performed and negative with exception of: As per HPI.  ALLERGIES: No Known Allergies  HOME MEDICATIONS: Outpatient Medications Prior to Visit  Medication Sig Dispense Refill  . aspirin EC 81 MG tablet Take 81 mg by mouth daily.    Marland Kitchen co-enzyme Q-10 30 MG capsule Take 30 mg by mouth daily.    Marland Kitchen diltiazem (TIAZAC) 120 MG 24 hr capsule Take by mouth.    . fluticasone (FLONASE) 50 MCG/ACT nasal spray INSTIL 2 SPRAYS DAILY INTO EACH NOSTRIL    . furosemide (LASIX) 20 MG tablet as needed.    . metoprolol succinate (TOPROL-XL) 100 MG 24 hr tablet Take 100 mg by mouth daily. Take with or immediately following a meal.    . OVER THE COUNTER MEDICATION Zyflamend (Tumeric) 2 tablets every day by mouth.    . pantoprazole (PROTONIX) 40 MG tablet Take 40 mg by mouth daily.     Marland Kitchen spironolactone (ALDACTONE) 25 MG tablet Take 25 mg by mouth daily. (Patient not taking: Reported on 01/22/2020)    . chlorhexidine (PERIDEX) 0.12 % solution RINSE FOR 30 SECONDS WITH 15ML QAM AND QPM FOR 2 WEEKS  0  . Krill Oil 300 MG CAPS Take 300 mg by mouth daily.    Marland Kitchen LORazepam (ATIVAN) 1 MG tablet Take 1 mg by mouth every 8 (eight) hours as needed for anxiety.    . penicillin v potassium (VEETID) 500 MG tablet TK 1 T PO  QID UNTIL GONE  0   No facility-administered medications prior to visit.    PAST MEDICAL HISTORY: Past Medical History:  Diagnosis Date  . Atrial fibrillation ([place])   . Facial tingling   . Fibromyalgia   . Gastric polyps   . HTN (hypertension), benign   . Hyperlipemia   . IBS (irritable bowel syndrome)   . Osteoarthritis   . Rheumatoid heart disease    as a child    PAST SURGICAL HISTORY: Past Surgical History:  Procedure Laterality Date  . ABDOMINAL HYSTERECTOMY    . CATARACT EXTRACTION Bilateral   . ESOPHAGOGASTRODUODENOSCOPY    . KNEE SURGERY Right    for MRSA infection    FAMILY HISTORY: Family History  Problem Relation Age of Onset  . Angina Mother   .  Heart disease Mother   . Heart failure Mother   . Emphysema Father   . Heart disease Father   . Non-Hodgkin's lymphoma Brother     SOCIAL HISTORY: Social History   Socioeconomic History  . Marital status: Widowed    Spouse name: Not on file  . Number of children: Not on file  . Years of education: Not on file  . Highest education level: Not on file  Occupational History  . Occupation: Retired  Tobacco Use  . Smoking status: Former Smoker    Packs/day: 0.25    Years: 20.00    Pack years: 5.00    Types: Cigarettes    Quit date: 02/20/1976    Years since quitting: 43.9  . Smokeless tobacco: Never Used  Substance and Sexual Activity  . Alcohol use: No    Alcohol/week: 0.0 standard drinks  . Drug use: Not on file  . Sexual activity: Not on file  Other Topics Concern  . Not on  file  Social History Narrative   Lives alone,lives near daughter. Widowed.   Retired.   [employer] x 26 yrs.   Social Determinants of Health   Financial Resource Strain:   . Difficulty of Paying Living Expenses: Not on file  Food Insecurity:   . Worried About Charity fundraiser in the Last Year: Not on file  . Ran Out of Food in the Last Year: Not on file  Transportation Needs:   . Lack of Transportation (Medical): Not on file  . Lack of Transportation (Non-Medical): Not on file  Physical Activity:   . Days of Exercise per Week: Not on file  . Minutes of Exercise per Session: Not on file  Stress:   . Feeling of Stress : Not on file  Social Connections:   . Frequency of Communication with Friends and Family: Not on file  . Frequency of Social Gatherings with Friends and Family: Not on file  . Attends Religious Services: Not on file  . Active Member of Clubs or Organizations: Not on file  . Attends Archivist Meetings: Not on file  . Marital Status: Not on file  Intimate Partner Violence:   . Fear of Current or Ex-Partner: Not on file  . Emotionally Abused: Not on file  . Physically Abused: Not on file  . Sexually Abused: Not on file     PHYSICAL EXAM  GENERAL EXAM/CONSTITUTIONAL: Vitals:  Vitals:   01/22/20 1020  BP: (!) 158/97  Pulse: 79  Weight: 207 lb (93.9 kg)  Height: 5\' 3"  (1.6 m)     Body mass index is 36.67 kg/m. Wt Readings from Last 3 Encounters:  01/22/20 207 lb (93.9 kg)  08/10/14 215 lb (97.5 kg)  07/06/14 215 lb 12.8 oz (97.9 kg)     Patient is in no distress; well developed, nourished and groomed; neck is supple  CARDIOVASCULAR:  Examination of carotid arteries is normal; no carotid bruits  Regular rate and rhythm, no murmurs  Examination of peripheral vascular system by observation and palpation is normal  EYES:  Ophthalmoscopic exam of optic discs and posterior segments is normal; no papilledema or  hemorrhages  No exam data present  MUSCULOSKELETAL:  Gait, strength, tone, movements noted in Neurologic exam below  NEUROLOGIC: MENTAL STATUS:  No flowsheet data found.  awake, alert, oriented to person, place and time  recent and remote memory intact  normal attention and concentration  language fluent, comprehension intact, naming intact  fund of knowledge  appropriate  CRANIAL NERVE:   2nd - no papilledema on fundoscopic exam  2nd, 3rd, 4th, 6th - pupils equal and reactive to light, visual fields full to confrontation, extraocular muscles intact, no nystagmus  5th - facial sensation symmetric  7th - facial strength --> DECR LEFT NL FOLD AT REST; FULL STRENGTH ON SMILE AND EYEBROW RAISE; INTERMITTENT LEFT HEMIFACIAL SPASM  8th - hearing intact  9th - palate elevates symmetrically, uvula midline  11th - shoulder shrug symmetric  12th - tongue protrusion midline  MOTOR:   normal bulk and tone, full strength in the BUE, BLE  SENSORY:   normal and symmetric to light touch, temperature, vibration  COORDINATION:   finger-nose-finger, fine finger movements normal  REFLEXES:   deep tendon reflexes TRACE and symmetric  GAIT/STATION:   narrow based gait     DIAGNOSTIC DATA (LABS, IMAGING, TESTING) - I reviewed patient records, labs, notes, testing and imaging myself where available.  No results found for: WBC, HGB, HCT, MCV, PLT No results found for: NA, K, CL, CO2, GLUCOSE, BUN, CREATININE, CALCIUM, PROT, ALBUMIN, AST, ALT, ALKPHOS, BILITOT, GFRNONAA, GFRAA No results found for: CHOL, HDL, LDLCALC, LDLDIRECT, TRIG, CHOLHDL No results found for: HGBA1C No results found for: VITAMINB12 No results found for: TSH     ASSESSMENT AND PLAN  79 y.o. year old female here with:  Dx:  1. Hemifacial spasm of left side of face   2. Other hearing loss of left ear with unrestricted hearing of right ear      PLAN:  LEFT HEMIFACIAL SPASM (since Sept  2021) - check MRI brain and IAC - monitor symptoms; may consider botox, carbamazepine, clonazepam, baclofen  Orders Placed This Encounter  Procedures  . MR BRAIN/IAC W WO CONTRAST   Return for pending if symptoms worsen or fail to improve.    Penni Bombard, MD 86/08/5447, 20:10 AM Certified in Neurology, Neurophysiology and Neuroimaging  [facility] [address], [place], [place] [address] [phone]

## 2020-01-22 NOTE — Patient Instructions (Signed)
LEFT HEMIFACIAL SPASM (since Sept 2021) - check MRI brain and IAC - monitor symptoms

## 2020-01-25 ENCOUNTER — Telehealth: Payer: Self-pay | Admitting: Diagnostic Neuroimaging

## 2020-01-25 NOTE — Telephone Encounter (Signed)
[employer] medicare order sent to [facility]. They will obtain the auth and reach out to the patient to schedule.  °

## 2020-01-26 ENCOUNTER — Other Ambulatory Visit: Payer: Self-pay | Admitting: Diagnostic Neuroimaging

## 2020-02-01 NOTE — Telephone Encounter (Signed)
[employer] medicare Josem Kaufmann: [id number] (Exp. 02/01/20 to 07/30/20) patient is scheduled at [facility] for 02/04/20.

## 2020-02-04 ENCOUNTER — Ambulatory Visit
Admission: RE | Admit: 2020-02-04 | Discharge: 2020-02-04 | Disposition: A | Discharge status: Home / Self Care | Payer: Medicare Other | Source: Ambulatory Visit | Attending: Diagnostic Neuroimaging | Admitting: Diagnostic Neuroimaging

## 2020-02-04 ENCOUNTER — Other Ambulatory Visit: Payer: Self-pay

## 2020-02-04 DIAGNOSIS — G5132 Clonic hemifacial spasm, left: Secondary | ICD-10-CM

## 2020-02-04 MED ORDER — GADOBENATE DIMEGLUMINE 529 MG/ML IV SOLN
20.0000 mL | Freq: Once | INTRAVENOUS | Status: AC | PRN
  Administered 2020-02-04: 20 mL via INTRAVENOUS

## 2020-02-09 ENCOUNTER — Telehealth: Payer: Self-pay | Admitting: Diagnostic Neuroimaging

## 2020-02-09 NOTE — Telephone Encounter (Signed)
Pt would like to know if she should get the Booster shot

## 2020-02-09 NOTE — Telephone Encounter (Signed)
I called pt. She wants to know if she can get the booster shot since she had a recent MRI brain. I advised her that per CDC recommendations she is eligible for her covid booster shot 6 months after her second dose of the initial vaccine series. If she has further questions about the booster I recommended she contact her PCP. Pt advised pt that we will call her with MRI results when Dr. Leta Baptist has reviewed them. Pt verbalized understanding.

## 2020-02-18 ENCOUNTER — Telehealth: Payer: Self-pay | Admitting: Diagnostic Neuroimaging

## 2020-02-18 NOTE — Telephone Encounter (Signed)
Called patient and reviewed Dr Teofilo Pod note, answered questions to her stated satisfaction. I advised will have Dr Marjory Lies give his recommendations when he returns next Mon. Patient verbalized understanding, appreciation.

## 2020-02-18 NOTE — Telephone Encounter (Signed)
Pt. Called today to get MRI results that was done 12/16. Best call back is [phone]

## 2020-02-18 NOTE — Telephone Encounter (Signed)
Her brain MRI was done on 02/04/2020.  Apparently it was not resulted.  Please call patient back.   Brain MRI showed mild to moderate volume loss which we call atrophy, no acute findings, the left vertebral artery does seem to touch the area of the left facial nerve, displacing the nerve and the brainstem.  We can await additional recommendations and work-up from Dr. Marjory Lies when he returns next week.  No immediate action is required, he may recommend neurosurgical consultation.

## 2020-02-22 NOTE — Telephone Encounter (Signed)
Pt. asks if a copy of her report can be sent to her two doctors, Dr. Jeanie Sewer & Dr. Judithe Modest. She states she has tried today with her computer & has not been successful.

## 2020-02-22 NOTE — Telephone Encounter (Signed)
Reviewed imaging. Left vertebral artery could be causing nerve irritaiton and left hemifacial spasm. May consider neurosurgery consult if sxs continue to be severe. We can discuss via phone or video visit if needed. -VRP

## 2020-02-22 NOTE — Telephone Encounter (Addendum)
Spoke with patient and informed her of Dr [employer] recommendations. She  verbalized understanding, appreciation, will monitor and call back as needed.

## 2020-02-22 NOTE — Telephone Encounter (Signed)
Called patient and informed her both MDs can see all her information because we share the same EMR. Patient verbalized understanding, appreciation.

## 2020-03-02 ENCOUNTER — Telehealth: Payer: Self-pay | Admitting: Diagnostic Neuroimaging

## 2020-03-02 NOTE — Telephone Encounter (Signed)
Pt's daughter, Hermenia Fiscal (on [place]) calling in regard to MRI results, have spoken with the nurse about MRI results. We want to discuss what is to be done as the next step. We do not know what to do. Would like a call from the nurse

## 2020-03-02 NOTE — Telephone Encounter (Signed)
Called daughter, reviewed MD MRI result note with her. She stated they will look into which neurosurgeon she wants to see and let us know,  verbalized understanding, appreciation.

## 2020-03-29 ENCOUNTER — Telehealth: Payer: Self-pay

## 2020-03-29 DIAGNOSIS — R9089 Other abnormal findings on diagnostic imaging of central nervous system: Secondary | ICD-10-CM

## 2020-03-29 DIAGNOSIS — H918X2 Other specified hearing loss, left ear: Secondary | ICD-10-CM

## 2020-03-29 DIAGNOSIS — G5132 Clonic hemifacial spasm, left: Secondary | ICD-10-CM

## 2020-03-29 NOTE — Telephone Encounter (Signed)
Called patient who stated she has been trying find which neurosurgeon her insurance would approve. She is unsure which neurosurgeon to ask to see. After conversation she asked for referral to be placed to [facility].Joann Mcdonald She will let us know if she finds out her insurance won't cover.  I advised her Dr Leta Baptist will make referral. Patient verbalized understanding, appreciation.

## 2020-03-29 NOTE — Addendum Note (Signed)
Addended by: Minna Antis on: 03/29/2020 11:35 AM   Modules accepted: Orders

## 2020-03-29 NOTE — Telephone Encounter (Signed)
Patient called and asked for some guidance on choosing a neurosurgeon. Stated that she was fine with going to [facility] if that was recommended. Can you please give the patient a call back?

## 2020-03-29 NOTE — Telephone Encounter (Signed)
Orders Placed This Encounter  Procedures  . Ambulatory referral to Neurosurgery    Penni Bombard, MD 08/24/3941, 20:03 PM Certified in Neurology, Neurophysiology and Neuroimaging  [facility] [address], [place],  [address] [phone]

## 2020-04-18 DIAGNOSIS — H918X2 Other specified hearing loss, left ear: Secondary | ICD-10-CM | POA: Diagnosis not present

## 2020-04-18 DIAGNOSIS — R413 Other amnesia: Secondary | ICD-10-CM | POA: Diagnosis not present

## 2020-04-18 DIAGNOSIS — R42 Dizziness and giddiness: Secondary | ICD-10-CM | POA: Diagnosis not present

## 2020-04-18 DIAGNOSIS — G5132 Clonic hemifacial spasm, left: Secondary | ICD-10-CM | POA: Diagnosis not present

## 2020-04-18 DIAGNOSIS — R9089 Other abnormal findings on diagnostic imaging of central nervous system: Secondary | ICD-10-CM | POA: Diagnosis not present

## 2020-06-01 DIAGNOSIS — I10 Essential (primary) hypertension: Secondary | ICD-10-CM | POA: Diagnosis not present

## 2020-06-01 DIAGNOSIS — J984 Other disorders of lung: Secondary | ICD-10-CM | POA: Diagnosis not present

## 2020-06-01 DIAGNOSIS — I4821 Permanent atrial fibrillation: Secondary | ICD-10-CM | POA: Diagnosis not present

## 2020-06-01 DIAGNOSIS — M419 Scoliosis, unspecified: Secondary | ICD-10-CM | POA: Diagnosis not present

## 2020-06-20 DIAGNOSIS — R609 Edema, unspecified: Secondary | ICD-10-CM | POA: Diagnosis not present

## 2020-06-20 DIAGNOSIS — K219 Gastro-esophageal reflux disease without esophagitis: Secondary | ICD-10-CM | POA: Diagnosis not present

## 2020-06-20 DIAGNOSIS — Z6836 Body mass index (BMI) 36.0-36.9, adult: Secondary | ICD-10-CM | POA: Diagnosis not present

## 2020-06-20 DIAGNOSIS — Z809 Family history of malignant neoplasm, unspecified: Secondary | ICD-10-CM | POA: Diagnosis not present

## 2020-06-20 DIAGNOSIS — Z7982 Long term (current) use of aspirin: Secondary | ICD-10-CM | POA: Diagnosis not present

## 2020-06-20 DIAGNOSIS — D6869 Other thrombophilia: Secondary | ICD-10-CM | POA: Diagnosis not present

## 2020-06-20 DIAGNOSIS — I4891 Unspecified atrial fibrillation: Secondary | ICD-10-CM | POA: Diagnosis not present

## 2020-06-20 DIAGNOSIS — I10 Essential (primary) hypertension: Secondary | ICD-10-CM | POA: Diagnosis not present

## 2020-06-20 DIAGNOSIS — R32 Unspecified urinary incontinence: Secondary | ICD-10-CM | POA: Diagnosis not present

## 2020-07-07 DIAGNOSIS — L814 Other melanin hyperpigmentation: Secondary | ICD-10-CM | POA: Diagnosis not present

## 2020-07-07 DIAGNOSIS — L570 Actinic keratosis: Secondary | ICD-10-CM | POA: Diagnosis not present

## 2020-07-07 DIAGNOSIS — D225 Melanocytic nevi of trunk: Secondary | ICD-10-CM | POA: Diagnosis not present

## 2020-07-07 DIAGNOSIS — D1801 Hemangioma of skin and subcutaneous tissue: Secondary | ICD-10-CM | POA: Diagnosis not present

## 2020-07-07 DIAGNOSIS — D2239 Melanocytic nevi of other parts of face: Secondary | ICD-10-CM | POA: Diagnosis not present

## 2020-08-18 DIAGNOSIS — M810 Age-related osteoporosis without current pathological fracture: Secondary | ICD-10-CM | POA: Diagnosis not present

## 2020-08-18 DIAGNOSIS — E7800 Pure hypercholesterolemia, unspecified: Secondary | ICD-10-CM | POA: Diagnosis not present

## 2020-09-12 DIAGNOSIS — H18523 Epithelial (juvenile) corneal dystrophy, bilateral: Secondary | ICD-10-CM | POA: Diagnosis not present

## 2020-09-22 DIAGNOSIS — Z79899 Other long term (current) drug therapy: Secondary | ICD-10-CM | POA: Diagnosis not present

## 2020-09-22 DIAGNOSIS — Z87891 Personal history of nicotine dependence: Secondary | ICD-10-CM | POA: Diagnosis not present

## 2020-09-22 DIAGNOSIS — I10 Essential (primary) hypertension: Secondary | ICD-10-CM | POA: Diagnosis not present

## 2020-09-23 DIAGNOSIS — Z6835 Body mass index (BMI) 35.0-35.9, adult: Secondary | ICD-10-CM | POA: Diagnosis not present

## 2020-09-23 DIAGNOSIS — I10 Essential (primary) hypertension: Secondary | ICD-10-CM | POA: Diagnosis not present

## 2020-09-23 DIAGNOSIS — R69 Illness, unspecified: Secondary | ICD-10-CM | POA: Diagnosis not present

## 2020-09-23 DIAGNOSIS — N390 Urinary tract infection, site not specified: Secondary | ICD-10-CM | POA: Diagnosis not present

## 2020-12-05 DIAGNOSIS — I10 Essential (primary) hypertension: Secondary | ICD-10-CM | POA: Diagnosis not present

## 2020-12-05 DIAGNOSIS — M419 Scoliosis, unspecified: Secondary | ICD-10-CM | POA: Diagnosis not present

## 2020-12-05 DIAGNOSIS — I4821 Permanent atrial fibrillation: Secondary | ICD-10-CM | POA: Diagnosis not present

## 2020-12-05 DIAGNOSIS — J984 Other disorders of lung: Secondary | ICD-10-CM | POA: Diagnosis not present

## 2020-12-05 DIAGNOSIS — R002 Palpitations: Secondary | ICD-10-CM | POA: Diagnosis not present

## 2020-12-09 DIAGNOSIS — Z6835 Body mass index (BMI) 35.0-35.9, adult: Secondary | ICD-10-CM | POA: Diagnosis not present

## 2020-12-09 DIAGNOSIS — R109 Unspecified abdominal pain: Secondary | ICD-10-CM | POA: Diagnosis not present

## 2020-12-19 DIAGNOSIS — M810 Age-related osteoporosis without current pathological fracture: Secondary | ICD-10-CM | POA: Diagnosis not present

## 2020-12-19 DIAGNOSIS — E7800 Pure hypercholesterolemia, unspecified: Secondary | ICD-10-CM | POA: Diagnosis not present

## 2021-02-15 DIAGNOSIS — U071 COVID-19: Secondary | ICD-10-CM | POA: Diagnosis not present

## 2021-03-08 DIAGNOSIS — U099 Post covid-19 condition, unspecified: Secondary | ICD-10-CM | POA: Diagnosis not present

## 2021-03-08 DIAGNOSIS — R053 Chronic cough: Secondary | ICD-10-CM | POA: Diagnosis not present

## 2021-03-08 DIAGNOSIS — Z6835 Body mass index (BMI) 35.0-35.9, adult: Secondary | ICD-10-CM | POA: Diagnosis not present

## 2021-03-08 DIAGNOSIS — J45909 Unspecified asthma, uncomplicated: Secondary | ICD-10-CM | POA: Diagnosis not present

## 2021-03-21 DIAGNOSIS — E782 Mixed hyperlipidemia: Secondary | ICD-10-CM | POA: Diagnosis not present

## 2021-03-21 DIAGNOSIS — I10 Essential (primary) hypertension: Secondary | ICD-10-CM | POA: Diagnosis not present

## 2021-04-14 DIAGNOSIS — Z6835 Body mass index (BMI) 35.0-35.9, adult: Secondary | ICD-10-CM | POA: Diagnosis not present

## 2021-04-14 DIAGNOSIS — E782 Mixed hyperlipidemia: Secondary | ICD-10-CM | POA: Diagnosis not present

## 2021-04-14 DIAGNOSIS — Z79899 Other long term (current) drug therapy: Secondary | ICD-10-CM | POA: Diagnosis not present

## 2021-04-14 DIAGNOSIS — Z1331 Encounter for screening for depression: Secondary | ICD-10-CM | POA: Diagnosis not present

## 2021-04-14 DIAGNOSIS — R69 Illness, unspecified: Secondary | ICD-10-CM | POA: Diagnosis not present

## 2021-04-14 DIAGNOSIS — M810 Age-related osteoporosis without current pathological fracture: Secondary | ICD-10-CM | POA: Diagnosis not present

## 2021-04-14 DIAGNOSIS — I10 Essential (primary) hypertension: Secondary | ICD-10-CM | POA: Diagnosis not present

## 2021-04-14 DIAGNOSIS — Z0000 Encounter for general adult medical examination without abnormal findings: Secondary | ICD-10-CM | POA: Diagnosis not present

## 2021-06-05 DIAGNOSIS — J984 Other disorders of lung: Secondary | ICD-10-CM | POA: Diagnosis not present

## 2021-06-05 DIAGNOSIS — I4821 Permanent atrial fibrillation: Secondary | ICD-10-CM | POA: Diagnosis not present

## 2021-06-05 DIAGNOSIS — M419 Scoliosis, unspecified: Secondary | ICD-10-CM | POA: Diagnosis not present

## 2021-06-05 DIAGNOSIS — I10 Essential (primary) hypertension: Secondary | ICD-10-CM | POA: Diagnosis not present

## 2021-06-09 DIAGNOSIS — N8111 Cystocele, midline: Secondary | ICD-10-CM | POA: Diagnosis not present

## 2021-06-09 DIAGNOSIS — N393 Stress incontinence (female) (male): Secondary | ICD-10-CM | POA: Diagnosis not present

## 2021-06-09 DIAGNOSIS — N952 Postmenopausal atrophic vaginitis: Secondary | ICD-10-CM | POA: Diagnosis not present

## 2021-06-09 DIAGNOSIS — R3914 Feeling of incomplete bladder emptying: Secondary | ICD-10-CM | POA: Diagnosis not present

## 2021-06-09 DIAGNOSIS — N816 Rectocele: Secondary | ICD-10-CM | POA: Diagnosis not present

## 2021-06-09 DIAGNOSIS — N819 Female genital prolapse, unspecified: Secondary | ICD-10-CM | POA: Diagnosis not present

## 2021-07-14 DIAGNOSIS — N812 Incomplete uterovaginal prolapse: Secondary | ICD-10-CM | POA: Diagnosis not present

## 2021-07-14 DIAGNOSIS — Z4689 Encounter for fitting and adjustment of other specified devices: Secondary | ICD-10-CM | POA: Diagnosis not present

## 2021-07-14 DIAGNOSIS — N816 Rectocele: Secondary | ICD-10-CM | POA: Diagnosis not present

## 2021-07-14 DIAGNOSIS — R338 Other retention of urine: Secondary | ICD-10-CM | POA: Diagnosis not present

## 2021-08-18 DIAGNOSIS — N8111 Cystocele, midline: Secondary | ICD-10-CM | POA: Diagnosis not present

## 2021-08-18 DIAGNOSIS — N393 Stress incontinence (female) (male): Secondary | ICD-10-CM | POA: Diagnosis not present

## 2021-08-18 DIAGNOSIS — N816 Rectocele: Secondary | ICD-10-CM | POA: Diagnosis not present

## 2021-08-18 DIAGNOSIS — N819 Female genital prolapse, unspecified: Secondary | ICD-10-CM | POA: Diagnosis not present

## 2021-08-18 DIAGNOSIS — R339 Retention of urine, unspecified: Secondary | ICD-10-CM | POA: Diagnosis not present

## 2021-10-19 DIAGNOSIS — R319 Hematuria, unspecified: Secondary | ICD-10-CM | POA: Diagnosis not present

## 2021-10-19 DIAGNOSIS — R82998 Other abnormal findings in urine: Secondary | ICD-10-CM | POA: Diagnosis not present

## 2021-10-19 DIAGNOSIS — N393 Stress incontinence (female) (male): Secondary | ICD-10-CM | POA: Diagnosis not present

## 2021-10-19 DIAGNOSIS — N390 Urinary tract infection, site not specified: Secondary | ICD-10-CM | POA: Diagnosis not present

## 2021-10-19 DIAGNOSIS — B9629 Other Escherichia coli [E. coli] as the cause of diseases classified elsewhere: Secondary | ICD-10-CM | POA: Diagnosis not present

## 2021-10-27 DIAGNOSIS — N8110 Cystocele, unspecified: Secondary | ICD-10-CM | POA: Diagnosis not present

## 2021-10-27 DIAGNOSIS — Z01818 Encounter for other preprocedural examination: Secondary | ICD-10-CM | POA: Diagnosis not present

## 2021-10-27 DIAGNOSIS — N393 Stress incontinence (female) (male): Secondary | ICD-10-CM | POA: Diagnosis not present

## 2021-10-30 DIAGNOSIS — N8111 Cystocele, midline: Secondary | ICD-10-CM | POA: Diagnosis not present

## 2021-10-30 DIAGNOSIS — I4891 Unspecified atrial fibrillation: Secondary | ICD-10-CM | POA: Diagnosis not present

## 2021-10-30 DIAGNOSIS — N816 Rectocele: Secondary | ICD-10-CM | POA: Diagnosis not present

## 2021-10-30 DIAGNOSIS — Z01812 Encounter for preprocedural laboratory examination: Secondary | ICD-10-CM | POA: Diagnosis not present

## 2021-10-30 DIAGNOSIS — N393 Stress incontinence (female) (male): Secondary | ICD-10-CM | POA: Diagnosis not present

## 2021-10-30 DIAGNOSIS — Z01810 Encounter for preprocedural cardiovascular examination: Secondary | ICD-10-CM | POA: Diagnosis not present

## 2021-11-07 DIAGNOSIS — N816 Rectocele: Secondary | ICD-10-CM | POA: Diagnosis not present

## 2021-11-07 DIAGNOSIS — Z90710 Acquired absence of both cervix and uterus: Secondary | ICD-10-CM | POA: Diagnosis not present

## 2021-11-07 DIAGNOSIS — N819 Female genital prolapse, unspecified: Secondary | ICD-10-CM | POA: Diagnosis not present

## 2021-11-07 DIAGNOSIS — R339 Retention of urine, unspecified: Secondary | ICD-10-CM | POA: Diagnosis not present

## 2021-11-07 DIAGNOSIS — N8111 Cystocele, midline: Secondary | ICD-10-CM | POA: Diagnosis not present

## 2021-11-07 DIAGNOSIS — N393 Stress incontinence (female) (male): Secondary | ICD-10-CM | POA: Diagnosis not present

## 2021-11-17 DIAGNOSIS — N819 Female genital prolapse, unspecified: Secondary | ICD-10-CM | POA: Diagnosis not present

## 2021-11-17 DIAGNOSIS — Z9889 Other specified postprocedural states: Secondary | ICD-10-CM | POA: Diagnosis not present

## 2021-11-17 DIAGNOSIS — N393 Stress incontinence (female) (male): Secondary | ICD-10-CM | POA: Diagnosis not present

## 2021-12-08 DIAGNOSIS — J984 Other disorders of lung: Secondary | ICD-10-CM | POA: Diagnosis not present

## 2021-12-08 DIAGNOSIS — I4821 Permanent atrial fibrillation: Secondary | ICD-10-CM | POA: Diagnosis not present

## 2021-12-08 DIAGNOSIS — I10 Essential (primary) hypertension: Secondary | ICD-10-CM | POA: Diagnosis not present

## 2021-12-08 DIAGNOSIS — M419 Scoliosis, unspecified: Secondary | ICD-10-CM | POA: Diagnosis not present

## 2022-02-28 DIAGNOSIS — J40 Bronchitis, not specified as acute or chronic: Secondary | ICD-10-CM | POA: Diagnosis not present

## 2022-06-15 DIAGNOSIS — I4821 Permanent atrial fibrillation: Secondary | ICD-10-CM | POA: Diagnosis not present

## 2022-06-15 DIAGNOSIS — I10 Essential (primary) hypertension: Secondary | ICD-10-CM | POA: Diagnosis not present

## 2022-06-15 DIAGNOSIS — J984 Other disorders of lung: Secondary | ICD-10-CM | POA: Diagnosis not present

## 2022-06-15 DIAGNOSIS — M419 Scoliosis, unspecified: Secondary | ICD-10-CM | POA: Diagnosis not present

## 2022-10-16 DIAGNOSIS — N393 Stress incontinence (female) (male): Secondary | ICD-10-CM | POA: Diagnosis not present

## 2022-10-16 DIAGNOSIS — J45909 Unspecified asthma, uncomplicated: Secondary | ICD-10-CM | POA: Diagnosis not present

## 2022-10-16 DIAGNOSIS — I4891 Unspecified atrial fibrillation: Secondary | ICD-10-CM | POA: Diagnosis not present

## 2022-10-16 DIAGNOSIS — D6869 Other thrombophilia: Secondary | ICD-10-CM | POA: Diagnosis not present

## 2022-10-16 DIAGNOSIS — M810 Age-related osteoporosis without current pathological fracture: Secondary | ICD-10-CM | POA: Diagnosis not present

## 2022-10-16 DIAGNOSIS — Z008 Encounter for other general examination: Secondary | ICD-10-CM | POA: Diagnosis not present

## 2022-10-16 DIAGNOSIS — E669 Obesity, unspecified: Secondary | ICD-10-CM | POA: Diagnosis not present

## 2022-10-16 DIAGNOSIS — E785 Hyperlipidemia, unspecified: Secondary | ICD-10-CM | POA: Diagnosis not present

## 2022-10-16 DIAGNOSIS — I10 Essential (primary) hypertension: Secondary | ICD-10-CM | POA: Diagnosis not present

## 2022-10-16 DIAGNOSIS — K219 Gastro-esophageal reflux disease without esophagitis: Secondary | ICD-10-CM | POA: Diagnosis not present

## 2022-10-16 DIAGNOSIS — F411 Generalized anxiety disorder: Secondary | ICD-10-CM | POA: Diagnosis not present

## 2022-11-01 DIAGNOSIS — W19XXXA Unspecified fall, initial encounter: Secondary | ICD-10-CM | POA: Diagnosis not present

## 2022-11-01 DIAGNOSIS — M25562 Pain in left knee: Secondary | ICD-10-CM | POA: Diagnosis not present

## 2022-11-01 DIAGNOSIS — Z6832 Body mass index (BMI) 32.0-32.9, adult: Secondary | ICD-10-CM | POA: Diagnosis not present

## 2022-11-26 DIAGNOSIS — S92352A Displaced fracture of fifth metatarsal bone, left foot, initial encounter for closed fracture: Secondary | ICD-10-CM | POA: Diagnosis not present

## 2023-01-15 DIAGNOSIS — S92352A Displaced fracture of fifth metatarsal bone, left foot, initial encounter for closed fracture: Secondary | ICD-10-CM | POA: Diagnosis not present

## 2023-03-20 DIAGNOSIS — B353 Tinea pedis: Secondary | ICD-10-CM | POA: Diagnosis not present

## 2023-04-12 ENCOUNTER — Ambulatory Visit: Payer: Self-pay | Admitting: Podiatry

## 2023-04-12 DIAGNOSIS — M79674 Pain in right toe(s): Secondary | ICD-10-CM | POA: Diagnosis not present

## 2023-04-12 DIAGNOSIS — M79675 Pain in left toe(s): Secondary | ICD-10-CM | POA: Diagnosis not present

## 2023-04-12 DIAGNOSIS — I739 Peripheral vascular disease, unspecified: Secondary | ICD-10-CM | POA: Diagnosis not present

## 2023-04-12 DIAGNOSIS — R601 Generalized edema: Secondary | ICD-10-CM

## 2023-04-12 DIAGNOSIS — B351 Tinea unguium: Secondary | ICD-10-CM | POA: Diagnosis not present

## 2023-04-12 NOTE — Progress Notes (Signed)
       Subjective:  Patient ID: Joann Mcdonald, female    DOB: 01/23/1941,  MRN: [id number]  Joann Mcdonald presents to clinic today for:  Chief Complaint  Patient presents with   [facility]    NP that wants to start [facility] with callous care. Her nails are thick and long with some discoloration. Not diabetic and takes ASA 81. She will also bring be a complete med lis the next time she come.    Patient notes nails are thick and elongated, causing pain in shoe gear when ambulating.  Patient states that she saw her PCP yesterday and was given 2 pills to take for her nail fungus but she does not know what they are.  She also notes that people tell her she has some cognitive decline but she does not feel that this is the case.  She tries to trim her nails herself.  She does wear compression stockings for swelling in her legs.  PCP is Noni Saupe, MD. last seen around 01/07/2023  Past Medical History:  Diagnosis Date   Atrial fibrillation (HCC)    Facial tingling    Fibromyalgia    Gastric polyps    HTN (hypertension), benign    Hyperlipemia    IBS (irritable bowel syndrome)    Osteoarthritis    Rheumatoid heart disease    as a child    No Known Allergies  Objective:  Joann Mcdonald is a pleasant 83 y.o. female in NAD. AAO x 3.  Vascular Examination: Patient has palpable DP pulse, absent PT pulse bilateral.  Delayed capillary refill bilateral toes.  Sparse digital hair bilateral.  Proximal to distal cooling WNL bilateral.  +1 pitting edema bilateral legs, ankles and feet  Dermatological Examination: Interspaces are clear with no open lesions noted bilateral.  Skin is shiny and atrophic bilateral.  Nails are 4mm thick, with yellowish/brown discoloration, subungual debris and distal onycholysis x10.  There is pain with compression of nails x10.  They are elongated  Patient qualifies for at-risk foot care because of PVD.  Assessment/Plan: 1. Pain due to onychomycosis of  toenails of both feet   2. PVD (peripheral vascular disease) (HCC)   3. Generalized edema    Mycotic nails x10 were sharply debrided with sterile nail nippers and power debriding burr to decrease bulk and length.  Will need to update her medication list next time she comes in as it seems most of the medications on file are not current, or the patient does not recall what she takes.   Return in about 3 months (around 07/10/2023) for RFC.   Clerance Lav, DPM, FACFAS [facility]     [address] [place], [place] [address]                Office [phone]  Fax [phone]

## 2023-04-15 DIAGNOSIS — J069 Acute upper respiratory infection, unspecified: Secondary | ICD-10-CM | POA: Diagnosis not present

## 2023-04-15 DIAGNOSIS — H1789 Other corneal scars and opacities: Secondary | ICD-10-CM | POA: Diagnosis not present

## 2023-04-15 DIAGNOSIS — M79672 Pain in left foot: Secondary | ICD-10-CM | POA: Diagnosis not present

## 2023-07-12 ENCOUNTER — Ambulatory Visit: Payer: Medicare HMO | Admitting: Podiatry

## 2023-07-12 DIAGNOSIS — M79674 Pain in right toe(s): Secondary | ICD-10-CM

## 2023-07-12 DIAGNOSIS — B351 Tinea unguium: Secondary | ICD-10-CM

## 2023-07-12 DIAGNOSIS — M79675 Pain in left toe(s): Secondary | ICD-10-CM | POA: Diagnosis not present

## 2023-07-12 MED ORDER — CICLOPIROX 8 % EX SOLN: Freq: Every day | CUTANEOUS | 11 refills | Status: DC

## 2023-07-12 NOTE — Progress Notes (Unsigned)
 Subjective:  Patient ID: Joann Mcdonald, female    DOB: Aug 27, 1940,  MRN: [id number]  Joann Mcdonald presents to clinic today for:  Chief Complaint  Patient presents with   [facility]    RFC today with out callous. Not diabetic and takes ASA 81.    Patient notes nails are thick, discolored, elongated and painful in shoegear when trying to ambulate.  A female companion is with her today.  The patient was asked when she saw her family doctor last that she did not think it has been within the past 6 months.  Typically the patients will need to see their family physician every 6 months for their nail care to be covered.  Apparently, her companion had been texting somebody during the visit to inquire about this, and then proceeded to make a cell phone call during the examination today without stepping out of the room, and then informed me it had been within 3 months that the patient had been seen by her PCP.  Patient noted that she would like to start treating her nail fungus.  PCP is Madelyne Schiff, MD.  Past Medical History:  Diagnosis Date   Atrial fibrillation (HCC)    Facial tingling    Fibromyalgia    Gastric polyps    HTN (hypertension), benign    Hyperlipemia    IBS (irritable bowel syndrome)    Osteoarthritis    Rheumatoid heart disease    as a child   Past Surgical History:  Procedure Laterality Date   ABDOMINAL HYSTERECTOMY     CATARACT EXTRACTION Bilateral    ESOPHAGOGASTRODUODENOSCOPY     KNEE SURGERY Right    for MRSA infection   No Known Allergies  Review of Systems: Negative except as noted in the HPI.  Objective:  Joann Mcdonald is a pleasant 83 y.o. female in NAD. AAO x 3.  Vascular Examination: Capillary refill time is 3-5 seconds to toes bilateral. Palpable pedal pulses b/l LE. Digital hair present b/l.  Skin temperature gradient WNL b/l.    Dermatological Examination: Pedal skin with decreased turgor, texture and tone b/l. No open wounds. No  interdigital macerations b/l. Toenails x10 are 3mm thick, discolored, dystrophic with subungual debris. There is pain with compression of the nail plates.  They are elongated x10  Assessment/Plan: 1. Pain due to onychomycosis of toenails of both feet     Meds ordered this encounter  Medications   ciclopirox (PENLAC) 8 % solution    Sig: Apply topically at bedtime. Apply thin layer over nail. Apply daily over previous coat. Remove weekly with polish remover.    Dispense:  6.6 mL    Refill:  11   The mycotic toenails were sharply debrided x10 with sterile nail nippers and a power debriding burr to decrease bulk/thickness and length.    Informed the patient that we will send in prescription ciclopirox nail lacquer to her pharmacy.  She will need to apply this once daily to the toenails.  At the end of each week, she will need to remove the lacquer buildup with either nail polish remover or rubbing alcohol, and possibly with the aid of a nail file to remove all lacquer before starting the next week.  Will recheck at her follow-up in 3 months   Joann Mcdonald D. Sacha Radloff, DPM, FACFAS [facility]     [address]  [place], [place] [address]                Office [phone]  Fax [phone]

## 2023-10-11 ENCOUNTER — Ambulatory Visit: Admitting: Podiatry

## 2023-10-11 DIAGNOSIS — M79675 Pain in left toe(s): Secondary | ICD-10-CM | POA: Diagnosis not present

## 2023-10-11 DIAGNOSIS — B351 Tinea unguium: Secondary | ICD-10-CM

## 2023-10-11 DIAGNOSIS — I739 Peripheral vascular disease, unspecified: Secondary | ICD-10-CM

## 2023-10-11 DIAGNOSIS — M79674 Pain in right toe(s): Secondary | ICD-10-CM | POA: Diagnosis not present

## 2023-10-11 NOTE — Progress Notes (Signed)
     Subjective:  Patient ID: Joann Mcdonald, female    DOB: [birth year],  MRN: [id number]  Joann Mcdonald presents to clinic today for:  Chief Complaint  Patient presents with   [facility]    RFC with callous,  Not Diabetic  ASA   Patient notes nails are thick, discolored, elongated and painful in shoegear when trying to ambulate.  She had been prescribed prescription topical ciclopirox  at her last visit, but she states she never picked this up.  She Referring to taking it orally.  She was repeatedly reminded during the appointment that this is a topical solution and is safe and will not interact with other medicines.  PCP is Dottie Norleen PHEBE PONCE, MD.  Past Medical History:  Diagnosis Date   Atrial fibrillation (HCC)    Facial tingling    Fibromyalgia    Gastric polyps    HTN (hypertension), benign    Hyperlipemia    IBS (irritable bowel syndrome)    Osteoarthritis    Rheumatoid heart disease    as a child   Past Surgical History:  Procedure Laterality Date   ABDOMINAL HYSTERECTOMY     CATARACT EXTRACTION Bilateral    ESOPHAGOGASTRODUODENOSCOPY     KNEE SURGERY Right    for MRSA infection   Allergies  Allergen Reactions   Tetracyclines & Related Other (See Comments)    Cannot recall    Review of Systems: Negative except as noted in the HPI.  Objective:  Joann Mcdonald is a pleasant 83 y.o. female in NAD. AAO x 3.  Vascular Examination: Capillary refill time is 3-5 seconds to toes bilateral. Palpable pedal pulses b/l LE. Digital hair present b/l.  Skin temperature gradient WNL b/l.    Dermatological Examination: Pedal skin with decreased turgor, texture and tone b/l. No open wounds. No interdigital macerations b/l. Toenails x10 are 3-34mm thick, discolored, dystrophic with subungual debris. There is pain with compression of the nail plates.  They are elongated x10  Assessment/Plan: 1. Pain due to onychomycosis of toenails of both feet   2. PVD (peripheral  vascular disease) (HCC)    The mycotic toenails were sharply debrided x10 with sterile nail nippers and a power debriding burr to decrease bulk/thickness and length.    Patient prefers to hold off on the ciclopirox  at this time.  She states she will just come in regularly and have them debrided.  Will recheck at her follow-up in 3 months   Joann Mcdonald D. Joann Mcdonald, DPM, FACFAS [facility]     [address] [place], [place] [address]                Office [phone]  Fax [phone]

## 2023-10-14 DIAGNOSIS — L814 Other melanin hyperpigmentation: Secondary | ICD-10-CM | POA: Diagnosis not present

## 2023-10-14 DIAGNOSIS — L820 Inflamed seborrheic keratosis: Secondary | ICD-10-CM | POA: Diagnosis not present

## 2023-10-14 DIAGNOSIS — D225 Melanocytic nevi of trunk: Secondary | ICD-10-CM | POA: Diagnosis not present

## 2023-10-14 DIAGNOSIS — L821 Other seborrheic keratosis: Secondary | ICD-10-CM | POA: Diagnosis not present

## 2023-10-14 DIAGNOSIS — D485 Neoplasm of uncertain behavior of skin: Secondary | ICD-10-CM | POA: Diagnosis not present

## 2023-10-14 DIAGNOSIS — D2239 Melanocytic nevi of other parts of face: Secondary | ICD-10-CM | POA: Diagnosis not present

## 2023-10-22 DIAGNOSIS — D0462 Carcinoma in situ of skin of left upper limb, including shoulder: Secondary | ICD-10-CM | POA: Diagnosis not present

## 2024-01-03 ENCOUNTER — Ambulatory Visit: Admitting: Gastroenterology

## 2024-01-10 ENCOUNTER — Ambulatory Visit: Admitting: Podiatry

## 2024-01-10 DIAGNOSIS — M79675 Pain in left toe(s): Secondary | ICD-10-CM

## 2024-01-10 DIAGNOSIS — I739 Peripheral vascular disease, unspecified: Secondary | ICD-10-CM

## 2024-01-10 DIAGNOSIS — B351 Tinea unguium: Secondary | ICD-10-CM | POA: Diagnosis not present

## 2024-01-10 DIAGNOSIS — M79674 Pain in right toe(s): Secondary | ICD-10-CM | POA: Diagnosis not present

## 2024-01-10 NOTE — Progress Notes (Signed)
     Subjective:  Patient ID: Joann Mcdonald, female    DOB: October 03, 1940,  MRN: [id number]  Joann Mcdonald presents to clinic today for:  Chief Complaint  Patient presents with   [facility]    Not diabetic, takes asa 81 mg, has callous skin medial 1st bilaterally. Did not pick up the ciclopirox  from pharmacy.    Patient notes nails are thick, discolored, elongated and painful in shoegear when trying to ambulate.  She has opted not to treat the fungus in the nails  PCP is Dottie Norleen PHEBE PONCE, MD.  Past Medical History:  Diagnosis Date   Atrial fibrillation (HCC)    Facial tingling    Fibromyalgia    Gastric polyps    HTN (hypertension), benign    Hyperlipemia    IBS (irritable bowel syndrome)    Osteoarthritis    Rheumatoid heart disease    as a child   Past Surgical History:  Procedure Laterality Date   ABDOMINAL HYSTERECTOMY     CATARACT EXTRACTION Bilateral    ESOPHAGOGASTRODUODENOSCOPY     KNEE SURGERY Right    for MRSA infection   Allergies  Allergen Reactions   Tetracyclines & Related Other (See Comments)    Cannot recall    Review of Systems: Negative except as noted in the HPI.  Objective:  Joann Mcdonald is a pleasant 83 y.o. female in NAD. AAO x 3.  Vascular Examination: Capillary refill time is 3-5 seconds to toes bilateral.  DP 2/4, PT 0/4 bilateral.  +1 pitting edema bilateral lower legs and ankles.  Dermatological Examination: Pedal skin with decreased turgor, texture and tone b/l. No open wounds. No interdigital macerations b/l. Toenails x10 are 3-61mm thick, discolored, dystrophic with subungual debris. There is pain with compression of the nail plates.  They are elongated x10  Assessment/Plan: 1. Pain due to onychomycosis of toenails of both feet   2. PVD (peripheral vascular disease)    The mycotic toenails were sharply debrided x10 with sterile nail nippers and a power debriding burr to decrease bulk/thickness and length.    Will recheck at  her follow-up in 3 months   Joann Mcdonald D. Jaedon Siler, DPM, FACFAS [facility]     [address] [place], [place] [address]                Office [phone]  Fax [phone]

## 2024-01-21 DIAGNOSIS — D0462 Carcinoma in situ of skin of left upper limb, including shoulder: Secondary | ICD-10-CM | POA: Diagnosis not present

## 2024-01-21 DIAGNOSIS — C44629 Squamous cell carcinoma of skin of left upper limb, including shoulder: Secondary | ICD-10-CM | POA: Diagnosis not present

## 2024-03-13 ENCOUNTER — Encounter: Payer: Self-pay | Admitting: Gastroenterology

## 2024-03-16 ENCOUNTER — Ambulatory Visit: Admitting: Gastroenterology

## 2024-04-10 ENCOUNTER — Ambulatory Visit: Admitting: Podiatry

## 2024-04-13 ENCOUNTER — Ambulatory Visit: Admitting: Gastroenterology
